# Patient Record
Sex: Male | Born: 1970 | Race: Black or African American | Hispanic: No | Marital: Married | Smoking: Never smoker
Health system: Southern US, Community
[De-identification: ages and names within clinical notes are randomized; demographics above are authoritative.]

## PROBLEM LIST (undated history)

## (undated) DIAGNOSIS — R748 Abnormal levels of other serum enzymes: Secondary | ICD-10-CM

## (undated) DIAGNOSIS — M25511 Pain in right shoulder: Secondary | ICD-10-CM

## (undated) DIAGNOSIS — M25512 Pain in left shoulder: Secondary | ICD-10-CM

---

## 2006-11-24 ENCOUNTER — Ambulatory Visit: Payer: Self-pay | Admitting: Family Medicine

## 2008-07-11 ENCOUNTER — Ambulatory Visit: Payer: Self-pay | Admitting: Family Medicine

## 2013-07-08 ENCOUNTER — Ambulatory Visit (INDEPENDENT_AMBULATORY_CARE_PROVIDER_SITE_OTHER): Payer: BC Managed Care – PPO | Admitting: Family Medicine

## 2013-07-08 VITALS — BP 120/80 | HR 62 | Temp 98.4°F | Resp 16 | Ht 71.0 in | Wt 213.0 lb

## 2013-07-08 DIAGNOSIS — M7632 Iliotibial band syndrome, left leg: Secondary | ICD-10-CM

## 2013-07-08 DIAGNOSIS — M629 Disorder of muscle, unspecified: Secondary | ICD-10-CM

## 2013-07-08 DIAGNOSIS — Z0000 Encounter for general adult medical examination without abnormal findings: Secondary | ICD-10-CM

## 2013-07-08 DIAGNOSIS — G5600 Carpal tunnel syndrome, unspecified upper limb: Secondary | ICD-10-CM

## 2013-07-08 DIAGNOSIS — M25569 Pain in unspecified knee: Secondary | ICD-10-CM

## 2013-07-08 DIAGNOSIS — M25539 Pain in unspecified wrist: Secondary | ICD-10-CM

## 2013-07-08 DIAGNOSIS — M25562 Pain in left knee: Secondary | ICD-10-CM

## 2013-07-08 DIAGNOSIS — M25531 Pain in right wrist: Secondary | ICD-10-CM

## 2013-07-08 LAB — CBC WITH DIFFERENTIAL/PLATELET
Basophils Absolute: 0 10*3/uL (ref 0.0–0.1)
Basophils Relative: 0 % (ref 0–1)
Eosinophils Absolute: 0 10*3/uL (ref 0.0–0.7)
Eosinophils Relative: 1 % (ref 0–5)
HCT: 44.8 % (ref 39.0–52.0)
Hemoglobin: 15.6 g/dL (ref 13.0–17.0)
Lymphocytes Relative: 34 % (ref 12–46)
Lymphs Abs: 1.6 10*3/uL (ref 0.7–4.0)
MCH: 29.4 pg (ref 26.0–34.0)
MCHC: 34.8 g/dL (ref 30.0–36.0)
MCV: 84.4 fL (ref 78.0–100.0)
Monocytes Absolute: 0.4 10*3/uL (ref 0.1–1.0)
Monocytes Relative: 9 % (ref 3–12)
Neutro Abs: 2.6 10*3/uL (ref 1.7–7.7)
Neutrophils Relative %: 56 % (ref 43–77)
Platelets: 212 10*3/uL (ref 150–400)
RBC: 5.31 MIL/uL (ref 4.22–5.81)
RDW: 14 % (ref 11.5–15.5)
WBC: 4.6 10*3/uL (ref 4.0–10.5)

## 2013-07-08 NOTE — Progress Notes (Addendum)
Subjective:    Patient ID: Mark Hamilton, male    DOB: 06/09/1970, 42 y.o.   MRN: [id number] This chart was scribed for Meredith Staggers, MD by Valera Castle, ED Scribe. This patient was seen in room 02 and the patient's care was started at 10:22 AM.  Authored by Silas Sacramento. MD. Unable to change in [facility].   Chief Complaint  Patient presents with   Annual Exam    HPI Mark Hamilton is a 43 y.o. male New pt is here for a complete physical. He denies knowing when he last had physical, blood work. Longer than several years ago. He denies knowing when his last tetanus vaccination was. He denies having flu immunization this year. He denies family h/o early cancer, prostate cancer. He reports his father has h/o HTN, mother has h/o DM that is diet controlled. He denies his father taking medication. He denies h/o smoking, EtOH use, drug use. He reports being married with 2 children. He denies exercising regularly. He denies taking any medication regularly.   He reports right wrist pain, stating he may have carpal tunnel. 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 He denies having been seen for it. He states he has a wrist brace at home, but has not used it recently.   He also presents with intermittent, left knee pain for a few months. He denies any trauma to his knee. He states he started exercising on treadmill and after exercising he notices the pain. He denies it being a sharp pain, but states he can feel pain with certain movements. He reports his pain is usually worse at the end of the day. He denies having tried ice, heat for his knee.   He denies pain in his groin area with lifting. He denies any lumps to the same area. He denies complications with erections, intercourse. He denies other sexual partners besides his wife.   He reports he saw eye doctor last week, and was given new Rx. He reports he  last saw dentist 6 months ago. He denies any other symptoms.   PCP - No primary provider on file.  There are no active problems to display for this patient.  History reviewed. No pertinent past medical history. History reviewed. No pertinent past surgical history. Allergies  Allergen Reactions   Penicillins     Since childhood   Prior to Admission medications   Not on File   Review of Systems  Musculoskeletal: Positive for arthralgias (left knee) and myalgias (right wrist).  All other systems reviewed and are negative.      Objective:   Physical Exam  Vitals reviewed. Constitutional: He is oriented to person, place, and time. He appears well-developed and well-nourished.  HENT:  Head: Normocephalic and atraumatic.  Right Ear: External ear normal.  Left Ear: External ear normal.  Mouth/Throat: Oropharynx is clear and moist.  Eyes: Conjunctivae and EOM are normal. Pupils are equal, round, and reactive to light.  Neck: Normal range of motion. Neck supple. No thyromegaly present.  Cardiovascular: Normal rate, regular rhythm, normal heart sounds and intact distal pulses.   Pulmonary/Chest: Effort normal and breath sounds normal. No respiratory distress. He has no wheezes.  Abdominal: Soft. He exhibits no distension. There is no tenderness. Hernia confirmed negative in the right inguinal area and confirmed negative in the left inguinal area.  Musculoskeletal: Normal range of  motion. He exhibits no edema and no tenderness.  Right wrist FROM. Negative Tinel's. Negative Phalen's. Normal Thenar. Normal hypothenar bulk. Left knee FROM. No effusion, erythema. No joint line tenderness. Described area of pain is over lateral joint at ITB insertion, but negative OBER testing. Slight tight IT band. Negative McMurray. Negative varus and valgus stress. Negative Drawer. Negative Lachman's.   Lymphadenopathy:    He has no cervical adenopathy.  Neurological: He is alert and oriented to person,  place, and time. He has normal reflexes.  Skin: Skin is warm and dry.  Skin intact.   Psychiatric: He has a normal mood and affect. His behavior is normal.   BP 120/80   Pulse 62   Temp(Src) 98.4 F (36.9 C) (Oral)   Resp 16   Ht 5\' 11"  (1.803 m)   Wt 213 lb (96.616 kg)   BMI 29.72 kg/m2   SpO2 99%   Visual Acuity Screening   Right eye Left eye Both eyes  Without correction: n/a 20/40 20/30  With correction:         Assessment & Plan:  Mark Hamilton is a 43 y.o. male Annual physical exam - Plan: COMPLETE METABOLIC PANEL WITH GFR, Lipid panel, TSH, CBC with Differential - anticipatory guidance below. Labs above. rec'd exercise, and to check on tdap status - recommended esp. With children at home.   Right wrist pain - suspected CTS (carpal tunnel syndrome) - mild, without positive testing today.   Left lateral knee pain, Iliotibial band syndrome of left side suspected. No bony ttp, reassuring exam,  ITB stretch demonstrated and HEP as below. Recheck in next few weeks if not improving - sooner if worse.   No orders of the defined types were placed in this encounter.   Patient Instructions  You should receive a call or letter about your lab results within the next week to 10 days.  Check into tetanus - if not received "TDAP" in past 10 years, recommend return to have this done.  Brace if needed for wrist and stretches for knee, other info below. If not improving in next 2-3 weeks - recheck to discuss further and possible xrays.  Carpal Tunnel Syndrome The carpal tunnel is a narrow area located on the palm side of your wrist. The tunnel is formed by the wrist bones and ligaments. Nerves, blood vessels, and tendons pass through the carpal tunnel. Repeated wrist motion or certain diseases may cause swelling within the tunnel. This swelling pinches the main nerve in the wrist (median nerve) and causes the painful hand and arm condition called carpal tunnel syndrome. CAUSES   Repeated wrist  motions.  Wrist injuries.  Certain diseases like arthritis, diabetes, alcoholism, hyperthyroidism, and kidney failure.  Obesity.  Pregnancy. SYMPTOMS   A "pins and needles" feeling in your fingers or hand.  Tingling or numbness in your fingers or hand.  An aching feeling in your entire arm.  Wrist pain that goes up your arm to your shoulder.  Pain that goes down into your palm or fingers.  A weak feeling in your hands. DIAGNOSIS  Your caregiver will take your history and perform a physical exam. An electromyography test may be needed. This test measures electrical signals sent out by the muscles. The electrical signals are usually slowed by carpal tunnel syndrome. You may also need X-rays. TREATMENT  Carpal tunnel syndrome may clear up by itself. Your caregiver may recommend a wrist splint or medicine such as a nonsteroidal anti-inflammatory medicine. Cortisone injections  may help. Sometimes, surgery may be needed to free the pinched nerve.  HOME CARE INSTRUCTIONS   Take all medicine as directed by your caregiver. Only take over-the-counter or prescription medicines for pain, discomfort, or fever as directed by your caregiver.  If you were given a splint to keep your wrist from bending, wear it as directed. It is important to wear the splint at night. Wear the splint for as long as you have pain or numbness in your hand, arm, or wrist. This may take 1 to 2 months.  Rest your wrist from any activity that may be causing your pain. If your symptoms are work-related, you may need to talk to your employer about changing to a job that does not require using your wrist.  Put ice on your wrist after long periods of wrist activity.  Put ice in a plastic bag.  Place a towel between your skin and the bag.  Leave the ice on for 15-20 minutes, 03-04 times a day.  Keep all follow-up visits as directed by your caregiver. This includes any orthopedic referrals, physical therapy, and  rehabilitation. Any delay in getting necessary care could result in a delay or failure of your condition to heal. SEEK IMMEDIATE MEDICAL CARE IF:   You have new, unexplained symptoms.  Your symptoms get worse and are not helped or controlled with medicines. MAKE SURE YOU:   Understand these instructions.  Will watch your condition.  Will get help right away if you are not doing well or get worse. Document Released: 05/17/2000 Document Revised: 08/12/2011 Document Reviewed: 04/05/2011 [facility] Patient Information 2014 [place], [place].  Iliotibial Band Syndrome with Rehab The iliotibial (IT) band is a tendon that connects the hip muscles to the shinbone (tibia) and to one of the bones of the pelvis (ileum). The IT band passes by the knee and is often irritated by the outer portion of the knee (lateral femoral condyle). A fluid filled sac (bursa) exists between the tendon and the bone, to cushion and reduce friction. Overuse of the tendon may cause excessive friction, which results in IT band syndrome. This condition involves inflammation of the bursa (bursitis) and/or inflammation of the IT band (tendinitis). SYMPTOMS   Pain, tenderness, swelling, warmth, or redness over the IT band, at the outer knee (above the joint).  Pain that travels up or down the thigh or leg.  Initially, pain at the beginning of an exercise, that decreases once warmed up. Eventually, pain throughout the activity, getting worse as the activity continues. May cause the athlete to stop in the middle of training or competing.  Pain that gets worse when running down hills or stairs, on banked tracks, or next to the curb on the street.  Pain that increases when the foot of the affected leg hits the ground.  Possibly, a crackling sound (crepitation) when the tendon or bursa is moved or touched. CAUSES  IT band syndrome is caused by irritation of the IT band and the underlying bursa. This eventually results in  inflammation and pain. IT band syndrome is an overuse injury.  RISK INCREASES WITH:  Sports with repetitive knee-bending activities (distance running, cycling).  Incorrect training techniques, including sudden changes in the intensity, frequency, or duration of training.  Not enough rest between workouts.  Poor strength and flexibility, especially a tight IT band.  Failure to warm up properly before activity.  Bow legs.  Arthritis of the knee. PREVENTION   Warm up and stretch properly before activity.  Allow for adequate recovery between workouts.  Maintain physical fitness:  Strength, flexibility, and endurance.  Cardiovascular fitness.  Learn and use proper training technique, including reducing running mileage, shortening stride, and avoiding running on hills and banked surfaces.  Wear arch supports (orthotics), if you have flat feet. PROGNOSIS  If treated properly, IT band syndrome usually goes away within 6 weeks of treatment. RELATED COMPLICATIONS   Longer healing time, if not properly treated, or if not given enough time to heal.  Recurring inflammation of the tendon and bursa, that may result in a chronic condition.  Recurring symptoms, if activity is resumed too soon, with overuse, with a direct blow, or with poor training technique.  Inability to complete training or competition. TREATMENT  Treatment first involves the use of ice and medicine, to reduce pain and inflammation. The use of strengthening and stretching exercises may help reduce pain with activity. These exercises may be performed at home or with a therapist. For individuals with flat feet, an arch support (orthotic) may be helpful. Some individuals find that wearing a knee sleeve or compression bandage around the knee during workouts provides some relief. Certain training techniques, such as adjusting stride length, avoiding running on hills or stairs, changing the direction you run on a circular or  banked track, or changing the side of the road you run on, if you run next to the curb, may help decrease symptoms of IT band syndrome. Cyclists may need to change the seat height or foot position on their bicycles. An injection of cortisone into the bursa may be recommended. Surgery to remove the inflamed bursa and/or part of the IT band is only considered after at least 6 months of non-surgical treatment.  MEDICATION   If pain medicine is needed, nonsteroidal anti-inflammatory medicines (aspirin and ibuprofen), or other minor pain relievers (acetaminophen), are often advised.  Do not take pain medicine for 7 days before surgery.  Prescription pain relievers may be given, if your caregiver thinks they are needed. Use only as directed and only as much as you need.  Corticosteroid injections may be given by your caregiver. These injections should be reserved for the most serious cases, because they may only be given a certain number of times. HEAT AND COLD  Cold treatment (icing) should be applied for 10 to 15 minutes every 2 to 3 hours for inflammation and pain, and immediately after activity that aggravates your symptoms. Use ice packs or an ice massage.  Heat treatment may be used before performing stretching and strengthening activities prescribed by your caregiver, physical therapist, or athletic trainer. Use a heat pack or a warm water soak. SEEK MEDICAL CARE IF:   Symptoms get worse or do not improve in 2 to 4 weeks, despite treatment.  New, unexplained symptoms develop. (Drugs used in treatment may produce side effects.) EXERCISES  RANGE OF MOTION (ROM) AND STRETCHING EXERCISES - Iliotibial Band Syndrome These exercises may help you when beginning to rehabilitate your injury. Your symptoms may go away with or without further involvement from your physician, physical therapist or athletic trainer. While completing these exercises, remember:   Restoring tissue flexibility helps normal  motion to return to the joints. This allows healthier, less painful movement and activity.  An effective stretch should be held for at least 30 seconds.  A stretch should never be painful. You should only feel a gentle lengthening or release in the stretched tissue. STRETCH - Quadriceps, Prone   Lie on your stomach on a  firm surface, such as a bed or padded floor.  Bend your right / left knee and grasp your ankle. If you are unable to reach your ankle or pant leg, use a belt around your foot to lengthen your reach.  Gently pull your heel toward your buttocks. Your knee should not slide out to the side. You should feel a stretch in the front of your thigh and knee.  Hold this position for __________ seconds. Repeat __________ times. Complete this stretch __________ times per day.  STRETCH  Iliotibial Band  On the floor or bed, lie on your side, so your right / left leg is on top. Bend your knee and grab your ankle.  Slowly bring your knee back so that your thigh is in line with your trunk. Keep your heel at your buttocks and gently arch your back, so your head, shoulders and hips line up.  Slowly lower your leg so that your knee approaches the floor or bed, until you feel a gentle stretch on the outside of your right / left thigh. If you do not feel a stretch and your knee will not fall farther, place the heel of your opposite foot on top of your knee, and pull your thigh down farther.  Hold this stretch for __________ seconds. Repeat __________ times. Complete this stretch __________ times per day. STRENGTHENING EXERCISES - Iliotibial Band Syndrome Improving the flexibility of the IT band will best relieve your discomfort due to IT band syndrome. Strengthening exercises, however, can help improve both muscle endurance and joint mechanics, reducing the factors that can contribute to this condition. Your physician, physical therapist or athletic trainer may provide you with exercises that  train specific muscle groups that are especially weak. The following exercises target muscles that are often weak in people who have IT band syndrome. STRENGTH - Hip Abductors, Straight Leg Raises  Be aware of your form throughout the entire exercise, so that you exercise the correct muscles. Poor form means that you are not strengthening the correct muscles.  Lie on your side, so that your head, shoulders, knee and hip line up. You may bend your lower knee to help maintain your balance. Your right / left leg should be on top.  Roll your hips slightly forward, so that your hips are stacked directly over each other and your right / left knee is facing forward.  Lift your top leg up 4-6 inches, leading with your heel. Be sure that your foot does not drift forward and that your knee does not roll toward the ceiling.  Hold this position for __________ seconds. You should feel the muscles in your outer hip lifting (you may not notice this until your leg begins to tire).  Slowly lower your leg to the starting position. Allow the muscles to fully relax before beginning the next repetition. Repeat __________ times. Complete this exercise __________ times per day.  STRENGTH - Quad/VMO, Isometric  Sit in a chair with your right / left knee slightly bent. With your fingertips, feel the VMO muscle (just above the inside of your knee). The VMO is important in controlling the position of your kneecap.  Keeping your fingertips on this muscle. Without actually moving your leg, attempt to drive your knee down, as if straightening your leg. You should feel your VMO tense. If you have a difficult time, you may wish to try the same exercise on your healthy knee first.  Tense this muscle as hard as you can, without increasing  any knee pain.  Hold for __________ seconds. Relax the muscles slowly and completely between each repetition. Repeat __________ times. Complete this exercise __________ times per day.    Document Released: 05/20/2005 Document Revised: 08/12/2011 Document Reviewed: 09/01/2008 [facility] Patient Information 2014 [place], [place].  Keeping you healthy  Get these tests  Blood pressure- Have your blood pressure checked once a year by your healthcare provider.  Normal blood pressure is 120/80.  Weight- Have your body mass index (BMI) calculated to screen for obesity.  BMI is a measure of body fat based on height and weight. You can also calculate your own BMI at https://www.west-esparza.com/www.[email or web address]/.  Cholesterol- Have your cholesterol checked regularly starting at age 42, sooner may be necessary if you have diabetes, high blood pressure, if a family member developed heart diseases at an early age or if you smoke.   Chlamydia, HIV, and other sexual transmitted disease- Get screened each year until the age of 42 then within three months of each new sexual partner.  Diabetes- Have your blood sugar checked regularly if you have high blood pressure, high cholesterol, a family history of diabetes or if you are overweight.  Get these vaccines  Flu shot- Every fall.  Tetanus shot- Every 10 years.  Menactra- Single dose; prevents meningitis.  Take these steps  Don't smoke- If you do smoke, ask your healthcare provider about quitting. For tips on how to quit, go to www.smokefree.gov or call 1-800-QUIT-NOW.  Be physically active- Exercise 5 days a week for at least 30 minutes.  If you are not already physically active start slow and gradually work up to 30 minutes of moderate physical activity.  Examples of moderate activity include walking briskly, mowing the yard, dancing, swimming bicycling, etc.  Eat a healthy diet- Eat a variety of healthy foods such as fruits, vegetables, low fat milk, low fat cheese, yogurt, lean meats, poultry, fish, beans, tofu, etc.  For more information on healthy eating, go to www.thenutritionsource.org  Drink alcohol in moderation- Limit alcohol intake two drinks or  less a day.  Never drink and drive.  Dentist- Brush and floss teeth twice daily; visit your dentis twice a year.  Depression-Your emotional health is as important as your physical health.  If you're feeling down, losing interest in things you normally enjoy please talk with your healthcare provider.  Gun Safety- If you keep a gun in your home, keep it unloaded and with the safety lock on.  Bullets should be stored separately.  Helmet use- Always wear a helmet when riding a motorcycle, bicycle, rollerblading or skateboarding.  Safe sex- If you may be exposed to a sexually transmitted infection, use a condom  Seat belts- Seat bels can save your life; always wear one.  Smoke/Carbon Monoxide detectors- These detectors need to be installed on the appropriate level of your home.  Replace batteries at least once a year.  Skin Cancer- When out in the sun, cover up and use sunscreen SPF 15 or higher.  Violence- If anyone is threatening or hurting you, please tell your healthcare provider.  I personally performed the services described in this documentation, which was scribed in my presence. The recorded information has been reviewed and considered, and addended by me as needed.

## 2013-07-08 NOTE — Patient Instructions (Addendum)
You should receive a call or letter about your lab results within the next week to 10 days.  Check into tetanus - if not received "TDAP" in past 10 years, recommend return to have this done.  Brace if needed for wrist and stretches for knee, other info below. If not improving in next 2-3 weeks - recheck to discuss further and possible xrays.  Carpal Tunnel Syndrome The carpal tunnel is a narrow area located on the palm side of your wrist. The tunnel is formed by the wrist bones and ligaments. Nerves, blood vessels, and tendons pass through the carpal tunnel. Repeated wrist motion or certain diseases may cause swelling within the tunnel. This swelling pinches the main nerve in the wrist (median nerve) and causes the painful hand and arm condition called carpal tunnel syndrome. CAUSES   Repeated wrist motions.  Wrist injuries.  Certain diseases like arthritis, diabetes, alcoholism, hyperthyroidism, and kidney failure.  Obesity.  Pregnancy. SYMPTOMS   A "pins and needles" feeling in your fingers or hand.  Tingling or numbness in your fingers or hand.  An aching feeling in your entire arm.  Wrist pain that goes up your arm to your shoulder.  Pain that goes down into your palm or fingers.  A weak feeling in your hands. DIAGNOSIS  Your caregiver will take your history and perform a physical exam. An electromyography test may be needed. This test measures electrical signals sent out by the muscles. The electrical signals are usually slowed by carpal tunnel syndrome. You may also need X-rays. TREATMENT  Carpal tunnel syndrome may clear up by itself. Your caregiver may recommend a wrist splint or medicine such as a nonsteroidal anti-inflammatory medicine. Cortisone injections may help. Sometimes, surgery may be needed to free the pinched nerve.  HOME CARE INSTRUCTIONS   Take all medicine as directed by your caregiver. Only take over-the-counter or prescription medicines for pain,  discomfort, or fever as directed by your caregiver.  If you were given a splint to keep your wrist from bending, wear it as directed. It is important to wear the splint at night. Wear the splint for as long as you have pain or numbness in your hand, arm, or wrist. This may take 1 to 2 months.  Rest your wrist from any activity that may be causing your pain. If your symptoms are work-related, you may need to talk to your employer about changing to a job that does not require using your wrist.  Put ice on your wrist after long periods of wrist activity.  Put ice in a plastic bag.  Place a towel between your skin and the bag.  Leave the ice on for 15-20 minutes, 03-04 times a day.  Keep all follow-up visits as directed by your caregiver. This includes any orthopedic referrals, physical therapy, and rehabilitation. Any delay in getting necessary care could result in a delay or failure of your condition to heal. SEEK IMMEDIATE MEDICAL CARE IF:   You have new, unexplained symptoms.  Your symptoms get worse and are not helped or controlled with medicines. MAKE SURE YOU:   Understand these instructions.  Will watch your condition.  Will get help right away if you are not doing well or get worse. Document Released: 05/17/2000 Document Revised: 08/12/2011 Document Reviewed: 04/05/2011 [facility] Patient Information 2014 [place], [place].  Iliotibial Band Syndrome with Rehab The iliotibial (IT) band is a tendon that connects the hip muscles to the shinbone (tibia) and to one of the bones of the  pelvis (ileum). The IT band passes by the knee and is often irritated by the outer portion of the knee (lateral femoral condyle). A fluid filled sac (bursa) exists between the tendon and the bone, to cushion and reduce friction. Overuse of the tendon may cause excessive friction, which results in IT band syndrome. This condition involves inflammation of the bursa (bursitis) and/or inflammation of the IT band  (tendinitis). SYMPTOMS   Pain, tenderness, swelling, warmth, or redness over the IT band, at the outer knee (above the joint).  Pain that travels up or down the thigh or leg.  Initially, pain at the beginning of an exercise, that decreases once warmed up. Eventually, pain throughout the activity, getting worse as the activity continues. May cause the athlete to stop in the middle of training or competing.  Pain that gets worse when running down hills or stairs, on banked tracks, or next to the curb on the street.  Pain that increases when the foot of the affected leg hits the ground.  Possibly, a crackling sound (crepitation) when the tendon or bursa is moved or touched. CAUSES  IT band syndrome is caused by irritation of the IT band and the underlying bursa. This eventually results in inflammation and pain. IT band syndrome is an overuse injury.  RISK INCREASES WITH:  Sports with repetitive knee-bending activities (distance running, cycling).  Incorrect training techniques, including sudden changes in the intensity, frequency, or duration of training.  Not enough rest between workouts.  Poor strength and flexibility, especially a tight IT band.  Failure to warm up properly before activity.  Bow legs.  Arthritis of the knee. PREVENTION   Warm up and stretch properly before activity.  Allow for adequate recovery between workouts.  Maintain physical fitness:  Strength, flexibility, and endurance.  Cardiovascular fitness.  Learn and use proper training technique, including reducing running mileage, shortening stride, and avoiding running on hills and banked surfaces.  Wear arch supports (orthotics), if you have flat feet. PROGNOSIS  If treated properly, IT band syndrome usually goes away within 6 weeks of treatment. RELATED COMPLICATIONS   Longer healing time, if not properly treated, or if not given enough time to heal.  Recurring inflammation of the tendon and  bursa, that may result in a chronic condition.  Recurring symptoms, if activity is resumed too soon, with overuse, with a direct blow, or with poor training technique.  Inability to complete training or competition. TREATMENT  Treatment first involves the use of ice and medicine, to reduce pain and inflammation. The use of strengthening and stretching exercises may help reduce pain with activity. These exercises may be performed at home or with a therapist. For individuals with flat feet, an arch support (orthotic) may be helpful. Some individuals find that wearing a knee sleeve or compression bandage around the knee during workouts provides some relief. Certain training techniques, such as adjusting stride length, avoiding running on hills or stairs, changing the direction you run on a circular or banked track, or changing the side of the road you run on, if you run next to the curb, may help decrease symptoms of IT band syndrome. Cyclists may need to change the seat height or foot position on their bicycles. An injection of cortisone into the bursa may be recommended. Surgery to remove the inflamed bursa and/or part of the IT band is only considered after at least 6 months of non-surgical treatment.  MEDICATION   If pain medicine is needed, nonsteroidal anti-inflammatory medicines (aspirin  and ibuprofen), or other minor pain relievers (acetaminophen), are often advised.  Do not take pain medicine for 7 days before surgery.  Prescription pain relievers may be given, if your caregiver thinks they are needed. Use only as directed and only as much as you need.  Corticosteroid injections may be given by your caregiver. These injections should be reserved for the most serious cases, because they may only be given a certain number of times. HEAT AND COLD  Cold treatment (icing) should be applied for 10 to 15 minutes every 2 to 3 hours for inflammation and pain, and immediately after activity that  aggravates your symptoms. Use ice packs or an ice massage.  Heat treatment may be used before performing stretching and strengthening activities prescribed by your caregiver, physical therapist, or athletic trainer. Use a heat pack or a warm water soak. SEEK MEDICAL CARE IF:   Symptoms get worse or do not improve in 2 to 4 weeks, despite treatment.  New, unexplained symptoms develop. (Drugs used in treatment may produce side effects.) EXERCISES  RANGE OF MOTION (ROM) AND STRETCHING EXERCISES - Iliotibial Band Syndrome These exercises may help you when beginning to rehabilitate your injury. Your symptoms may go away with or without further involvement from your physician, physical therapist or athletic trainer. While completing these exercises, remember:   Restoring tissue flexibility helps normal motion to return to the joints. This allows healthier, less painful movement and activity.  An effective stretch should be held for at least 30 seconds.  A stretch should never be painful. You should only feel a gentle lengthening or release in the stretched tissue. STRETCH - Quadriceps, Prone   Lie on your stomach on a firm surface, such as a bed or padded floor.  Bend your right / left knee and grasp your ankle. If you are unable to reach your ankle or pant leg, use a belt around your foot to lengthen your reach.  Gently pull your heel toward your buttocks. Your knee should not slide out to the side. You should feel a stretch in the front of your thigh and knee.  Hold this position for __________ seconds. Repeat __________ times. Complete this stretch __________ times per day.  STRETCH  Iliotibial Band  On the floor or bed, lie on your side, so your right / left leg is on top. Bend your knee and grab your ankle.  Slowly bring your knee back so that your thigh is in line with your trunk. Keep your heel at your buttocks and gently arch your back, so your head, shoulders and hips line  up.  Slowly lower your leg so that your knee approaches the floor or bed, until you feel a gentle stretch on the outside of your right / left thigh. If you do not feel a stretch and your knee will not fall farther, place the heel of your opposite foot on top of your knee, and pull your thigh down farther.  Hold this stretch for __________ seconds. Repeat __________ times. Complete this stretch __________ times per day. STRENGTHENING EXERCISES - Iliotibial Band Syndrome Improving the flexibility of the IT band will best relieve your discomfort due to IT band syndrome. Strengthening exercises, however, can help improve both muscle endurance and joint mechanics, reducing the factors that can contribute to this condition. Your physician, physical therapist or athletic trainer may provide you with exercises that train specific muscle groups that are especially weak. The following exercises target muscles that are often weak in  people who have IT band syndrome. STRENGTH - Hip Abductors, Straight Leg Raises  Be aware of your form throughout the entire exercise, so that you exercise the correct muscles. Poor form means that you are not strengthening the correct muscles.  Lie on your side, so that your head, shoulders, knee and hip line up. You may bend your lower knee to help maintain your balance. Your right / left leg should be on top.  Roll your hips slightly forward, so that your hips are stacked directly over each other and your right / left knee is facing forward.  Lift your top leg up 4-6 inches, leading with your heel. Be sure that your foot does not drift forward and that your knee does not roll toward the ceiling.  Hold this position for __________ seconds. You should feel the muscles in your outer hip lifting (you may not notice this until your leg begins to tire).  Slowly lower your leg to the starting position. Allow the muscles to fully relax before beginning the next repetition. Repeat  __________ times. Complete this exercise __________ times per day.  STRENGTH - Quad/VMO, Isometric  Sit in a chair with your right / left knee slightly bent. With your fingertips, feel the VMO muscle (just above the inside of your knee). The VMO is important in controlling the position of your kneecap.  Keeping your fingertips on this muscle. Without actually moving your leg, attempt to drive your knee down, as if straightening your leg. You should feel your VMO tense. If you have a difficult time, you may wish to try the same exercise on your healthy knee first.  Tense this muscle as hard as you can, without increasing any knee pain.  Hold for __________ seconds. Relax the muscles slowly and completely between each repetition. Repeat __________ times. Complete this exercise __________ times per day.  Document Released: 05/20/2005 Document Revised: 08/12/2011 Document Reviewed: 09/01/2008 [facility] Patient Information 2014 [place], [place].  Keeping you healthy  Get these tests  Blood pressure- Have your blood pressure checked once a year by your healthcare provider.  Normal blood pressure is 120/80.  Weight- Have your body mass index (BMI) calculated to screen for obesity.  BMI is a measure of body fat based on height and weight. You can also calculate your own BMI at https://www.west-esparza.com/.  Cholesterol- Have your cholesterol checked regularly starting at age 86, sooner may be necessary if you have diabetes, high blood pressure, if a family member developed heart diseases at an early age or if you smoke.   Chlamydia, HIV, and other sexual transmitted disease- Get screened each year until the age of 67 then within three months of each new sexual partner.  Diabetes- Have your blood sugar checked regularly if you have high blood pressure, high cholesterol, a family history of diabetes or if you are overweight.  Get these vaccines  Flu shot- Every fall.  Tetanus shot- Every 10  years.  Menactra- Single dose; prevents meningitis.  Take these steps  Don't smoke- If you do smoke, ask your healthcare provider about quitting. For tips on how to quit, go to www.smokefree.gov or call 1-800-QUIT-NOW.  Be physically active- Exercise 5 days a week for at least 30 minutes.  If you are not already physically active start slow and gradually work up to 30 minutes of moderate physical activity.  Examples of moderate activity include walking briskly, mowing the yard, dancing, swimming bicycling, etc.  Eat a healthy diet- Eat a variety of  healthy foods such as fruits, vegetables, low fat milk, low fat cheese, yogurt, lean meats, poultry, fish, beans, tofu, etc.  For more information on healthy eating, go to www.thenutritionsource.org  Drink alcohol in moderation- Limit alcohol intake two drinks or less a day.  Never drink and drive.  Dentist- Brush and floss teeth twice daily; visit your dentis twice a year.  Depression-Your emotional health is as important as your physical health.  If you're feeling down, losing interest in things you normally enjoy please talk with your healthcare provider.  Gun Safety- If you keep a gun in your home, keep it unloaded and with the safety lock on.  Bullets should be stored separately.  Helmet use- Always wear a helmet when riding a motorcycle, bicycle, rollerblading or skateboarding.  Safe sex- If you may be exposed to a sexually transmitted infection, use a condom  Seat belts- Seat bels can save your life; always wear one.  Smoke/Carbon Monoxide detectors- These detectors need to be installed on the appropriate level of your home.  Replace batteries at least once a year.  Skin Cancer- When out in the sun, cover up and use sunscreen SPF 15 or higher.  Violence- If anyone is threatening or hurting you, please tell your healthcare provider.

## 2013-07-09 LAB — COMPLETE METABOLIC PANEL WITH GFR
ALT: 31 U/L (ref 0–53)
AST: 24 U/L (ref 0–37)
Albumin: 4.3 g/dL (ref 3.5–5.2)
Alkaline Phosphatase: 85 U/L (ref 39–117)
BUN: 12 mg/dL (ref 6–23)
CO2: 28 meq/L (ref 19–32)
Calcium: 9.7 mg/dL (ref 8.4–10.5)
Chloride: 101 mEq/L (ref 96–112)
Creat: 0.98 mg/dL (ref 0.50–1.35)
GLUCOSE: 100 mg/dL — AB (ref 70–99)
POTASSIUM: 4.3 meq/L (ref 3.5–5.3)
Sodium: 136 mEq/L (ref 135–145)
Total Bilirubin: 0.8 mg/dL (ref 0.2–1.2)
Total Protein: 7.3 g/dL (ref 6.0–8.3)

## 2013-07-09 LAB — LIPID PANEL
CHOLESTEROL: 208 mg/dL — AB (ref 0–200)
HDL: 36 mg/dL — ABNORMAL LOW (ref >39)
LDL Cholesterol: 117 mg/dL — ABNORMAL HIGH (ref 0–99)
Total CHOL/HDL Ratio: 5.8 Ratio
Triglycerides: 273 mg/dL — ABNORMAL HIGH (ref <150)
VLDL: 55 mg/dL — ABNORMAL HIGH (ref 0–40)

## 2013-07-09 LAB — TSH: TSH: 1.983 u[IU]/mL (ref 0.350–4.500)

## 2014-03-01 ENCOUNTER — Ambulatory Visit: Payer: BC Managed Care – PPO

## 2014-03-04 ENCOUNTER — Ambulatory Visit (INDEPENDENT_AMBULATORY_CARE_PROVIDER_SITE_OTHER): Payer: BC Managed Care – PPO | Admitting: Family Medicine

## 2014-03-04 ENCOUNTER — Ambulatory Visit (INDEPENDENT_AMBULATORY_CARE_PROVIDER_SITE_OTHER): Payer: BC Managed Care – PPO

## 2014-03-04 VITALS — BP 122/82 | HR 75 | Temp 97.8°F | Resp 16 | Ht 71.5 in | Wt 219.0 lb

## 2014-03-04 DIAGNOSIS — R6882 Decreased libido: Secondary | ICD-10-CM

## 2014-03-04 DIAGNOSIS — M79645 Pain in left finger(s): Secondary | ICD-10-CM

## 2014-03-04 DIAGNOSIS — R635 Abnormal weight gain: Secondary | ICD-10-CM

## 2014-03-04 DIAGNOSIS — R5383 Other fatigue: Secondary | ICD-10-CM

## 2014-03-04 NOTE — Progress Notes (Signed)
Subjective:    Patient ID: RAYKWON HOBBS, male    DOB: 1971-02-01, 43 y.o.   MRN: [id number]  HPI Antwone GARY GABRIELSEN is a 43 y.o. male  L hand pain for about a month or more  - may have hyperextended.  No swelling initially, just sore. Pain in 2nd MCP L hand. No known swelling or redness now. Still sore. No treatment other than decrease in use.   R hand dominant.   Also would like to have testosterone checked. Sexually active with wife only, no extramarital sexual contact. Just not enjoying sex as much as in past and sex drive is a little bit lower. Able to achieve erection, and maintain erection normally. Denies marital discord but some stress with wife going to school, he is by himself with kids. Wife in med school. Denies depression. Has not exercised recently. Feels "lazy", and gaining some weight. Denies heat or cold intolerance. No hx of thyroid disorder. Some associated fatigue.    There are no active problems to display for this patient.  History reviewed. No pertinent past medical history. History reviewed. No pertinent past surgical history. Allergies  Allergen Reactions  . Penicillins     Since childhood   Prior to Admission medications   Not on File   History   Social History  . Marital Status: Married    Spouse Name: N/A    Number of Children: N/A  . Years of Education: N/A   Occupational History  . Not on file.   Social History Main Topics  . Smoking status: Never Smoker   . Smokeless tobacco: Not on file  . Alcohol Use: Not on file  . Drug Use: Not on file  . Sexual Activity: Not on file   Other Topics Concern  . Not on file   Social History Narrative  . No narrative on file       Review of Systems  Constitutional: Negative for fever.  Musculoskeletal: Negative for joint swelling.  Skin: Negative for color change, rash and wound.       Objective:   Physical Exam  Vitals reviewed. Constitutional: He is oriented to person, place, and  time. He appears well-developed and well-nourished.  HENT:  Head: Normocephalic and atraumatic.  Eyes: EOM are normal. Pupils are equal, round, and reactive to light.  Neck: No JVD present. Carotid bruit is not present. No thyromegaly present.  Cardiovascular: Normal rate, regular rhythm and normal heart sounds.   No murmur heard. Pulmonary/Chest: Effort normal and breath sounds normal. He has no rales.  Genitourinary: Testes normal. Right testis shows no mass, no swelling and no tenderness. Left testis shows no mass, no swelling and no tenderness.  Musculoskeletal: He exhibits no edema.       Left hand: He exhibits bony tenderness. He exhibits no swelling. Normal sensation noted. Normal strength noted.       Hands: Neurological: He is alert and oriented to person, place, and time.  Skin: Skin is warm and dry.  Psychiatric: He has a normal mood and affect.   Filed Vitals:   03/04/14 1424  BP: 122/82  Pulse: 75  Temp: 97.8 F (36.6 C)  TempSrc: Oral  Resp: 16  Height: 5' 11.5" (1.816 m)  Weight: 219 lb (99.338 kg)  SpO2: 98%    [facility] reading (PRIMARY) by  Dr. Neva Seat. L 2nd phalynx: nad, no fx seen.       Assessment & Plan:  BRONDON WANN is a 43 y.o.  male Pain of finger of left hand - Plan: DG Finger Index Left  ?hyperextension and capsular strain.  No bony findings. At this point - continue ROM, would avoid splinting, but can also avoid forceful grasp for few weeks, then recheck if not improved.   Decreased libido, Other fatigue, Weight gain - Plan: TSH, Testosterone  -suspect stress/psychogenic component.  Discussed exercise, labs above, then recheck to discuss further if persists.    No orders of the defined types were placed in this encounter.   Patient Instructions  You likely strained the "capsule" of the knuckle on your first finger. Avoid overuse or forceful grasping of this finger for next few weeks, but can continue range of motion to keep finger from getting  stiff. If pain not improving in next few weeks - can recheck.   You should receive a call or letter about your lab results within the next week to 10 days. Try to restart exercise - even walking can be beneficial for weight and possibly sex drive. If labs are normal, follow up to discuss other causes of these symptoms.

## 2014-03-04 NOTE — Patient Instructions (Addendum)
You likely strained the "capsule" of the knuckle on your first finger. Avoid overuse or forceful grasping of this finger for next few weeks, but can continue range of motion to keep finger from getting stiff. If pain not improving in next few weeks - can recheck.   You should receive a call or letter about your lab results within the next week to 10 days. Try to restart exercise - even walking can be beneficial for weight and possibly sex drive. If labs are normal, follow up to discuss other causes of these symptoms.

## 2014-03-05 LAB — TSH: TSH: 1.45 u[IU]/mL (ref 0.350–4.500)

## 2014-03-05 LAB — TESTOSTERONE: TESTOSTERONE: 377 ng/dL (ref 300–890)

## 2014-03-16 ENCOUNTER — Encounter: Payer: Self-pay | Admitting: Radiology

## 2015-12-31 IMAGING — CR DG FINGER INDEX 2+V*L*
1 series · 1 of 1 positions shown · non-contrast
Comparison: None.

CLINICAL DATA: Pain after hyperextension

EXAM:
LEFT SECOND FINGER 2+V

[PA]
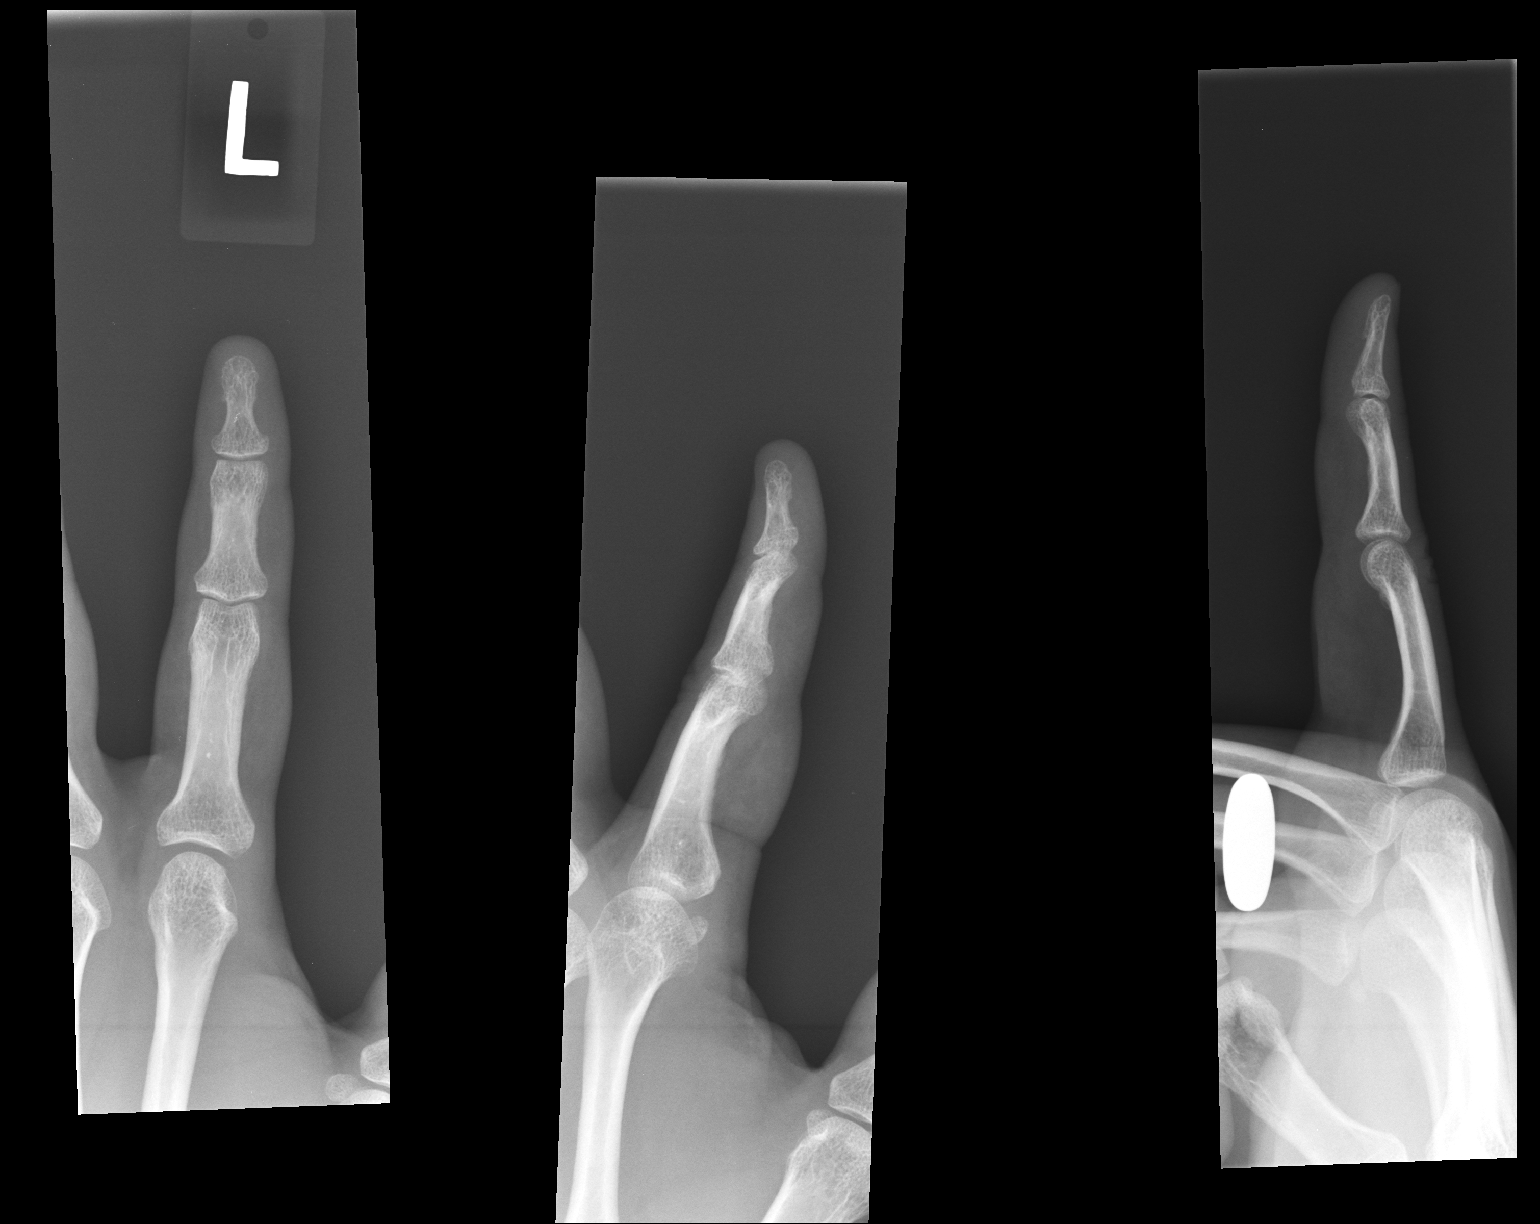

[1 of 1 positions shown; findings below may reference images not displayed]

FINDINGS: Frontal, oblique, and lateral views were obtained. There is no
fracture or dislocation. Joint spaces appear intact. No erosive
change.
IMPRESSION: No abnormality noted.

## 2016-07-02 ENCOUNTER — Telehealth: Payer: Self-pay

## 2016-07-02 NOTE — Telephone Encounter (Signed)
Pt calling about his wife having the flu and he would like tamiflu for himself to protect him   Please advise  [phone]

## 2016-07-02 NOTE — Telephone Encounter (Signed)
Patient advised would need to be seen.  Patient understood

## 2018-04-13 ENCOUNTER — Ambulatory Visit: Admit: 2018-04-13 | Discharge: 2018-04-13 | Payer: MEDICAID | Attending: Family Medicine | Primary: Registered Nurse

## 2018-04-13 DIAGNOSIS — Z7689 Persons encountering health services in other specified circumstances: Secondary | ICD-10-CM

## 2018-04-13 MED ORDER — MELOXICAM 15 MG PO TABS
15 MG | ORAL_TABLET | Freq: Every day | ORAL | 3 refills | Status: DC
Start: 2018-04-13 — End: 2019-04-14

## 2018-04-13 NOTE — Progress Notes (Addendum)
[facility] PHYSICIANS  [facility]  [address]  [address]  [place] [address]  Dept: [phone]      Jeffrey Zhang is a 47 y.o. male who presents today for follow up on his  medical conditions as noted below.      Chief Complaint   Patient presents with   ??? Establish Care   ??? Shoulder Pain     picked up his child 3 weeks ago and it has been bothering him since   ??? Back Pain     Between shoulder blades was doing some exercises and felt he stretched too far       There is no problem list on file for this patient.     History reviewed. No pertinent past medical history.   History reviewed. No pertinent surgical history.  Family History   Problem Relation Age of Onset   ??? High Blood Pressure Father        Current Outpatient Medications   Medication Sig Dispense Refill   ??? meloxicam (MOBIC) 15 MG tablet Take 1 tablet by mouth daily 30 tablet 3     No current facility-administered medications for this visit.      ALLERGIES:    Allergies   Allergen Reactions   ??? Pcn [Penicillins]        Social History     Tobacco Use   ??? Smoking status: Never Smoker   ??? Smokeless tobacco: Never Used   Substance Use Topics   ??? Alcohol use: Never     Frequency: Never        No results found for: LDLCALC, LDLCHOLESTEROL, HDL, BUN, CREATININE, GLUCOSE, LABA1C, LABMICR           Subjective:      HPI  Here today as a new patient to establish care  He also states a couple weeks ago or more he was lifting his child out of the highchair and developed sudden shoulder pain in the left shoulder.  Now if he goes to certain ranges of motion it hurts more.  He did try taking some ibuprofen 4 of them and it gave him an upset stomach  He also states he leaned back in the sauna and then developed some upper thoracic discomfort.  He states they recently have moved into a house that he has been doing a lot of lifting.    Review of Systems:     Constitutional: Negative for fever, appetite change and fatigue.         Family social and medical history reviewed and unchanged     HENT: Negative.  Negative for nosebleeds, trouble swallowing and neck pain.    Eyes: Negative for photophobia and visual disturbance.   Respiratory: Negative.  Negative for chest tightness and shortness of breath.    Cardiovascular: Negative.  Negative for chest pain and leg swelling.   Gastrointestinal: Negative.  Negative for abdominal pain and blood in stool.   Endocrine: Negative for cold intolerance and polyuria.   Genitourinary: Negative for dysuria and hematuria.   Musculoskeletal: Negative.    Skin: Negative for rash.   Allergic/Immunologic: Negative.    Neurological: Negative.  Negative for dizziness, weakness and numbness.   Hematological: Negative.  Negative for adenopathy. Does not bruise/bleed easily.   Psychiatric/Behavioral: Negative for sleep disturbance, dysphoric mood and  decreased concentration. The patient is not nervous/anxious.        Objective:     Physical Exam:  Nursing note and vitals reviewed.  BP 111/70    Pulse 79    Ht 6' (1.829 m)    Wt 193 lb (87.5 kg)    SpO2 98%    BMI 26.18 kg/m??   Constitutional: He is oriented to person, place, and time. He   appears well-developed and well-nourished.  HENT:   Head: Normocephalic and atraumatic.    Right Ear: External ear normal. Tympanic membrane is not erythematous. No middle ear effusion.    Left Ear: External ear normal. Tympanic membrane is not erythematous.  No middle ear effusion.   Nose: No mucosal edema.   Mouth/Throat: Oropharynx is clear and moist. No posterior oropharyngeal erythema.    Eyes: Conjunctivae and EOM are normal. Pupils are equal, round, and reactive to light.    Neck: Normal range of motion. Neck supple. No thyromegaly present.   Cardiovascular: Normal rate, regular rhythm and normal heart sounds.    No murmur heard.  Pulmonary/Chest: Effort normal and breath sounds normal. He has no wheezes. Hehas no rales.   Abdominal: Soft. Bowel sounds are normal. He  exhibits no distension and no mass.  There is no tenderness. There is no rebound and no guarding.   Genitourinary/Anorectal:deferred  Musculoskeletal: Normal range of motion. He exhibits no edema or mild  Tenderness. Ac are a of the left shoulder  And tightness of the paraspinal muscles of the mid thoracic spine    Lymphadenopathy: He has no cervical adenopathy.   Neurological: He is alert and oriented to person, place, and time. He has normal reflexes.   Skin: Skin is warm and dry. No rash noted.   Psychiatric: He has a normal mood and affect. His   behavior is normal.       Assessment:      1. Establishing care with new doctor, encounter for    2. Acute pain of left shoulder    3. Thoracic sprain    4. Need for influenza vaccination    5. Well adult exam          Plan:      Call or return to clinic prn if these symptoms worsen or fail to improve as anticipated.  I have reviewed the instructions with the patient, answering all questions to his satisfaction.    No follow-ups on file.  Orders Placed This Encounter   Procedures   ??? XR SHOULDER LEFT (MIN 2 VIEWS)     Standing Status:   Future     Standing Expiration Date:   04/14/2019     Order Specific Question:   Reason for exam:     Answer:   pain   ??? INFLUENZA, QUADV, 3 YRS AND OLDER, IM PF, PREFILL SYR OR SDV, 0.5ML (AFLURIA QUADV, PF)   ??? CBC Auto Differential     Standing Status:   Future     Standing Expiration Date:   10/12/2018   ??? Comprehensive Metabolic Panel     Standing Status:   Future     Standing Expiration Date:   10/12/2018   ??? Lipid Panel     Standing Status:   Future     Standing Expiration Date:   10/12/2018     Order Specific Question:   Is Patient Fasting?/# of Hours     Answer:   yes   ??? T4, Free     Standing Status:   Future     Standing Expiration Date:   10/12/2018   ??? Thyroid Peroxidase  Antibody     Standing Status:   Future     Standing Expiration Date:   10/12/2018   ??? TSH without Reflex     Standing Status:   Future     Standing Expiration  Date:   10/12/2018   ??? Testosterone, Free     Standing Status:   Future     Standing Expiration Date:   05/13/2018   ??? [facility] [place]     Referral Priority:   Routine     Referral Type:   Eval and Treat     Referral Reason:   Specialty Services Required     Requested Specialty:   Physical Therapy     Number of Visits Requested:   1     Orders Placed This Encounter   Medications   ??? meloxicam (MOBIC) 15 MG tablet     Sig: Take 1 tablet by mouth daily     Dispense:  30 tablet     Refill:  3     Given a flu shot   Electronically signed by Eyvonne Mechanic, DO on 04/13/2018 at 2:25 PM

## 2018-04-13 NOTE — Progress Notes (Signed)
Vaccine Information Sheet, "Influenza - Inactivated"  given to Jeffrey Zhang, or parent/legal guardian of  Jeffrey Zhang and verbalized understanding.    Patient responses:    Have you ever had a reaction to a flu vaccine? No  Do you have any current illness?  No  Have you ever had Guillian Barre Syndrome?  No  Do you have a serious allergy to any of the following: Neomycin, Polymyxin, Thimerosal, eggs or egg products? No    Flu vaccine given per order. Please see immunization tab.    Risks and benefits explained.  Current VIS given.      Immunizations Administered     Name Date Dose Route    Influenza, Quadv, IM, PF (6 mo and older Fluzone, Flulaval, Fluarix, and 3 yrs and older Afluria) 04/13/2018 0.5 mL Intramuscular    Site: Deltoid- Left    Lot: [id number]    NDC: 29562-130-86

## 2018-04-16 ENCOUNTER — Inpatient Hospital Stay: Admit: 2018-04-16 | Discharge: 2018-04-16 | Payer: MEDICAID | Primary: Registered Nurse

## 2018-04-16 DIAGNOSIS — M25512 Pain in left shoulder: Secondary | ICD-10-CM

## 2018-04-16 NOTE — Consults (Signed)
'[]'  [place]  [address]  [phone]  F: [phone] '[x]'  [place]  [address]   [address]  P: [phone]  F: [phone] '[]'  [place]  [place]  P: [phone]  F: [phone] '[]'  [facility]  Outpatient Rehabilitation &  Therapy  [place]   [address]   [place]: [phone]  F: [phone]      Physical Therapy Upper Extremity Evaluation    Date:  04/16/2018  Patient: Jeffrey Zhang  DOB: Jun 29, 1970  MRN: [id number]  Physician: Claudette Stapler, DO   Insurance: Jennefer Bravo, 20 visits   Medical Diagnosis:   M25.512 (ICD-10-CM) - Acute pain of left shoulder   S23.9XXA (ICD-10-CM) - Thoracic sprain   Rehab Codes: H37.169, M25.612, M54.6  Onset Date: 03/26/18   Next Dr.'s appt: to be scheduled     Subjective:   CC: left shoulder and thoracic pain     HPI: Pt reports onset of left shoulder pain beginning approximately 3 weeks ago after picking up child from high chair. Pt states that shoulder pain is more of a "dull irritating pain". Shoulder Pain is provoked with movement and use; pain is better with rest. Pt states he attempted to go to the gym and "work out his shoulder himself" by hanging on pull up bars and stretching which actually further irritated his pain. Pt also notes thoracic spine pain located midline. This dull pain is only present with extension of the spine or palpation; pt denies any pain with standing and sitting. Pt tried ibuprofen for pain relief, however this upset his stomach. Was prescribed Mobic- unsure if this has helped pain or not. Has not tried heat or ice.     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     Pain:  '[x]'  Yes  '[]'  No Location: L shoulder, T10-11  Spinous process  Pain Rating: (0-10 scale) L shoulder 4-5/10 with movement. Thoracic spine 3/10 with ext or palpation    Symptoms: '[x]'  Same  Better:  '[x]'  Lying   Worse: '[x]'  AM        '[x]'  Other: reaching   Sleep:     '[x]'  Disturbed d/t pain     Objective:     ROM  ??A/P END FEEL STRENGTH TESTS (+/-) Left Right Not Tested    Left Right  Left Right Drop Arm   '[]'    Sit Shld Flex Full   5 5 Sulcus Sign   '[]'    Sit Shld Abd Full   5 5 Apprehension   '[]'    Sit Shld IR T7   5 5 Yergasons   '[]'    Shoulder Flex      Speeds   '[]'    Ext      Neer +  '[]'    ABD  Hawkins  +  '[]'    ER @ 0 45 90 Full*   5 5 Painful Arc -  '[]'    IR      Tinel   '[]'    Supraspinatus      Yocum -     Infraspinatus            Serratus Ant            Pectoralis            Lats            Mid Trap            Lower Trap            Elbow Flex.    5 5       Elbow Ext.            Pronation            Supination            Wrist Flex.            Wrist Ext.            Rad. Dev.            Ulnar Dev.            Grip 66 lbs 77 lbs          * indicates pain    Abdominals 3/5  Full thoracic/lumbar ROM     OBSERVATION No Deficit Deficit Not Tested Comments   Forward Head '[]'  '[x]'  '[]'     Rounded Shoulders '[]'  '[x]'  '[]'     Kyphosis '[x]'  '[]'  '[]'     Scap Height/Position '[x]'  '[]'  '[]'     Winging '[x]'  '[]'  '[]'     SH Rhythm '[x]'  '[]'  '[]'     INSPECTION/PALPATION       SC/AC Joint '[x]'  '[]'  '[]'     Supraspinatus '[x]'  '[]'  '[]'     Biceps tendon/groove '[x]'  '[]'  '[]'     Posterior shld '[]'  '[x]'  '[]'  Superior medial border of scapula    Subscapularis '[x]'  '[]'  '[]'     NEUROLOGICAL       Cervical ROM/Quadrant '[x]'  '[]'  '[]'     Reflexes '[]'  '[]'  '[x]'     Compression/Distraction '[x]'  '[]'  '[]'     Sensation '[x]'  '[]'  '[]'         FUNCTION Normal Difficult Unable   Overhead reach '[]'  '[x]'  '[]'    Underarm reach  '[x]'  '[]'  '[]'    Groom/Dress '[]'  '[x]'  '[]'    Bra/Shirt tuck '[]'  '[x]'   '[]'    Lift/Carry '[]'  '[x]'  '[]'     '[]'  '[]'  '[]'      Comments: UEFS: 61/80, 24% impairment     Assessment:Pt presents to clinic with reports of acute L shoulder and thoracic pain. Injury occurred ~3 weeks ago with minimal improvement, likely because patient attempted inappropriate exercises for relief that actually caused further irritation. Pt's primary deficit is pain with all shoulder mobility and thoracic extension. Pt will benefit from physical therapy services in order to learn proper stretches and exercises for optimal functional mobility.     Problems:    '[x]'  ? Pain: 4-5/10 L shoulder pain with movement. 3/10 thoracic pain with extension.   '[x]'  ? Strength: weak abdominals   '[x]'  ? Function: reports 24% impairment on the UEFS  '[x]'  Other: Sleep disturbances     STG: (to be met in 4 treatments)  1. ? Pain: 0/10 average L shoulder and thoracic pain with movement for improved tolerance to ADL's  2. ?  Function:  a. Pt will report ability to don/doff clothing without increased pain for improved independence   b. Pt will report <20% impairment on the UEFS to indicate improved functional mobility   3. Independent with Home Exercise Programs  4. Demonstrate Knowledge of fall prevention  LTG: (to be met in 8 treatments)  1. Pt to demonstrate grossly 4/5 abdominal strength for improved thoracic and lumbar stability during lifting/carrying  2. Pt will report ability to lift, carry, transfer daughter without increased L shoulder pain for improved QOL  3. Pt will deny sleep disturbances d/t pain for improved sleep patterns   4. Pt to demonstrate proper mechanics for lifting from ground, waist, and chest height to decrease risk of re-injury                   Patient goals: decrease pain     Rehab Potential:  '[x]'  Good  '[]'  Fair  '[]'  Poor   Suggested Professional Referral:  '[x]'  No  '[]'  Yes:  Barriers to Goal Achievement:  '[x]'  No  '[]'  Yes:  Domestic Concerns:  '[x]'  No  '[]'  Yes:    Pt. Education:  '[x]'  Plans/Goals, Risks/Benefits discussed  '[x]'   Home exercise program  Method of Education: '[x]'  Verbal  '[x]'  Demo  '[x]'  Written  Comprehension of Education:  '[x]'  Verbalizes understanding.  '[x]'  Demonstrates understanding.  '[x]'  Needs Review.  '[]'  Demonstrates/verbalizes understanding of HEP/Ed previously given.    Treatment Plan:  '[x]'  Therapeutic Exercise   97110  '[]'  Iontophoresis: 4 mg/mL Dexamethasone Sodium Phosphate 40-120 mAmin  97033   '[x]'  Therapeutic Activity  97530 '[x]'  Vasopneumatic cold with compression  97016    '[]'  Gait Training   97116 '[]'  Ultrasound   97035   '[]'  Neuromuscular Re-education  97112 '[]'  Electrical Stimulation Unattended  97014   '[x]'  Manual Therapy  97140 '[]'  Electrical Stimulation Attended  97032   '[x]'  Instruction in HEP  '[]'  Dry Needling   '[]'  Aquatic Therapy   97113 '[x]'  Cold/hotpack    '[]'  Massage   97124      '[]'  Lumbar/Cervical Traction  97012     '[]'   Medication allergies reviewed for use of    Dexamethasone Sodium Phosphate 9m/ml     with iontophoresis treatments.   Pt is not allergic.       Frequency: 1-2 x/week for 8 visits    Today???s Treatment:  Modalities:   Precautions:  Exercises:  Exercise Reps/ Time Weight/ Level Comments   Manual: PA mobs to thoracic spine 5 min  Pain relief          Pendulum 2x30"     Wall slides: flex, abd 10x5"     Wall angel  10x           T-band      rows 10x lime    ER isometric walk out 10x lime          Open book stretch 10x5"           Other:    Specific Instructions for next treatment: continued thoracic and left shoulder stretching. Left shoulder stabilization with rhythmic stab, ball on wall dynamic stab, wall push ups, etc. Modalities as needed      Evaluation Complexity:  History (Personal factors, comorbidities) '[]'  0 '[x]'  1-2 '[]'  3+   Exam (limitations, restrictions) '[x]'  1-2 '[]'  3 '[]'  4+   Clinical presentation (progression) '[x]'  Stable '[]'  Evolving  '[]'  Unstable   Decision Making '[x]'  Low '[]'  Moderate '[]'  High    [  x] Low Complexity '[]'  Moderate Complexity '[]'  High Complexity       Treatment Charges: Mins Units    '[x]'  Evaluation       '[x]'   Low       '[]'   Moderate       '[]'   High 30 1   '[]'   Modalities     '[x]'   Ther Exercise 10 1   '[x]'   Manual Therapy 5 --   '[]'   Ther Activities     '[]'   Aquatics     '[]'   Vasocompression     '[]'   Other       TOTAL TREATMENT TIME: 45 minutes    Time in: 4:10 pm    Time Out: 5:00 pm    Electronically signed by: Beaulah Corin, PT        Physician Signature:________________________________Date:__________________  By signing above or cosigning this note, I have reviewed this plan of care and certify a need for medically necessary rehabilitation services.     *PLEASE SIGN ABOVE AND FAX BACK ALL PAGES*

## 2018-04-17 ENCOUNTER — Encounter

## 2018-04-20 NOTE — Other (Signed)
Xray is normal.

## 2018-04-23 ENCOUNTER — Inpatient Hospital Stay: Admit: 2018-04-23 | Discharge: 2018-04-23 | Payer: MEDICAID | Primary: Registered Nurse

## 2018-04-23 NOTE — Other (Signed)
'[]'  [place]  [address]  [phone]  F: [phone] '[x]'  [place]  [address]   [address]  P: [phone]  F: [phone] '[]'  [place]  [place]  P: [phone]  F: [phone] '[]'  [facility]  Outpatient Rehabilitation &  Therapy  [place]   [address]   [place]: [phone]  F: [phone]      Physical Therapy Daily Treatment Note    Date:  04/23/2018  Patient Name:  Jeffrey Zhang    DOB:  Jun 03, 1971  MRN: [id number]   Physician: Claudette Stapler, DO                                 Insurance: Jennefer Bravo, 20 visits   Medical Diagnosis:   M25.512 (ICD-10-CM) - Acute pain of left shoulder   S23.9XXA (ICD-10-CM) - Thoracic sprain   Rehab Codes: M25.512, M25.612, M54.6  Onset Date: 03/26/18             Next Dr.'s appt: to be scheduled   Visit# / total visits: 2/8  Cancels/No Shows: 0/0    Subjective:    Pain:  '[x]'  Yes  '[]'  No Location: L shoulder, T10-11  Spinous process Pain Rating: (0-10 scale) 0/10 at rest, 2-3/10 with movement   Pain altered Tx:  '[]'  No  '[]'  Yes  Action:  Comments: Pt states he has been completing HEP every other day, notes some improvement in pain. States he heard from his doctor that his x-rays were normal. Pt later states that he has been foam rolling his entire spine at the gym.     Objective:  Modalities: vasocompression to left shoulder x 15 minutes, moderate pressure   Precautions:  Exercises:  Exercise Reps/ Time Weight/ Level Comments         Pulleys 2/2           Manual: PA mobs to thoracic spine Not today ?? Pain relief    ?? ?? ?? ??   Pendulum PRN ?? PRN for discomfort throughout session   Wall slides: flex, abd 10x5" ?? ??   Wall angel  10x ?? ??   Wall push up plus 15x     Ball stab on wall 15xea yellow    Dynamic ball stab 2 x 30" Big redball    ?? ?? ?? ??   T-band ?? ??  ??moderate cueing required for form    Rows 15x lime ??   ER isometric walk out 15x lime ??   Shoulder ext 15x lime Added 11/21         Supine   Added 11/21   Rhythmic stab      SA punch 15x 2#    Chest fly 15x 2#    Thoracic ext over foam 5x     Supine over half foam for chest stretch 5x           Prone   Added 11/21   Row 10x 2#    Shoulder ext 10x 2#    T w/ full ER 10x A    ?? ?? ?? ??   Open book stretch 10x5" ?? ??   ?? ?? ?? ??  Other:    Specific Instructions for next treatment: continued thoracic and left shoulder stretching. Left shoulder stabilization with rhythmic stab, ball on wall dynamic stab, wall push ups, etc. Modalities as needed    Treatment Charges: Mins Units   '[]'   Modalities     '[x]'   Ther Exercise 40 3   '[]'   Manual Therapy     '[]'   Ther Activities     '[]'   Aquatics     '[x]'   Vasocompression 15 1   '[]'   Other     Total Treatment time 55 4       Assessment: '[x]'  Progressing toward goals. Pt requires moderate cueing throughout session to ensure proper form, especially for t-band exercises. Added foam roller stretches per pt request and he reports feeling a good, painless stretch. Pt notes some muscle fatigue/soreness throughout however reports that it is tolerable. Will continue to progress as tolerated; pt presenting like a rotator cuff tendinitis, thus will focus on isometric ER strengthening and progress to eccentrics/concentrics as able.     '[]'  No change.     '[]'  Other:    ??  STG: (to be met in 4 treatments)  1. ? Pain: 0/10 average L shoulder and thoracic pain with movement for improved tolerance to ADL's  2. ? Function:  a. Pt will report ability to don/doff clothing without increased pain for improved independence   b. Pt will report <20% impairment on the UEFS to indicate improved functional mobility   3. Independent with Home Exercise Programs  4. Demonstrate Knowledge of fall prevention  LTG: (to be met in 8 treatments)  1. Pt to demonstrate grossly 4/5 abdominal strength for improved thoracic and lumbar  stability during lifting/carrying  2. Pt will report ability to lift, carry, transfer daughter without increased L shoulder pain for improved QOL  3. Pt will deny sleep disturbances d/t pain for improved sleep patterns   4. Pt to demonstrate proper mechanics for lifting from ground, waist, and chest height to decrease risk of re-injury      Pt. Education:  '[x]'  Yes  '[]'  No  '[x]'  Reviewed Prior HEP/Ed  Method of Education: '[x]'  Verbal  '[x]'  Demo  '[]'  Written  Comprehension of Education:  '[x]'  Verbalizes understanding.  '[x]'  Demonstrates understanding.  '[]'  Needs review.  '[]'  Demonstrates/verbalizes HEP/Ed previously given.     Plan: '[x]'  Continue per plan of care.   '[]'  Other:      Time In: 5:45 pm           Time Out: 6:45 pm    Electronically signed by:  Beaulah Corin, PT

## 2018-04-28 NOTE — Other (Signed)
[]   [facility]  [facility] &  The[facility]  [address]    [phone]  F: [phone]   [x]  [facility]  Outpatient Rehabilitation &  Therapy  [address]   [address]  P: [phone]  F: [phone]  []  [facility]  Outpatient Rehabilitation &  Therapy  [address]  P: [phone]  F: [phone]  []  [facility] [place]  Outpatient Rehabilitation &  Therapy  [address]   [address]   [place]: [phone]  F: [phone]   []  [facility]   Outpatient Rehabilitation & Therapy  [address]  [place]: [phone]   F: [phone]     Physical Therapy Cancel/No Show note    Date: 04/28/2018  Patient: Jeffrey Zhang  DOB: [birth year]  MRN: [id number]    Cancels/No Shows to date: 0/1    For today's appointment patient:    []   Cancelled    []  Rescheduled appointment    [x]  No-show     Reason given by patient:    []   Patient ill    []   Conflicting appointment    []  No transportation      []  Conflict with work    []  No reason given    []  Weather related    [x]  Other: pt confused about appt time     Comments:        [x]  Next appointment was confirmed    Electronically signed by: Artemio AlyMatthew J Briselda Naval, PTA

## 2018-04-29 ENCOUNTER — Inpatient Hospital Stay: Payer: MEDICAID | Primary: Registered Nurse

## 2018-05-05 DIAGNOSIS — M25512 Pain in left shoulder: Secondary | ICD-10-CM

## 2018-05-05 NOTE — Other (Signed)
[]   [facility]  Outpatient [facility] &  Therapy  [address]    [phone]  F: [phone]   [x]  [facility]  Outpatient Rehabilitation &  Therapy  [address]   [address]  P: [phone]  F: [phone]  []  [facility]  Outpatient Rehabilitation &  Therapy  [address]  P: [phone]  F: [phone]  []  [facility] [place]  Outpatient [facility] &  Therapy  [address]   [address]   [place]: [phone]  F: [phone]   []  [facility]   Outpatient Rehabilitation & Therapy  [address]  [place]: [phone]   F: [phone]     Physical Therapy Cancel/No Show note    Date: 05/05/2018  Patient: Jeffrey Zhang  DOB: [birth year]  MRN: [id number]    Cancels/No Shows to date: 1/1    For today's appointment patient:    [x]   Cancelled    []  Rescheduled appointment    []  No-show     Reason given by patient:    []   Patient ill    []   Conflicting appointment    []  No transportation      []  Conflict with work    [x]  No reason given    []  Weather related    []  Other:      Comments:        [x]  Next appointment was confirmed    Electronically signed by: Artemio AlyMatthew J Kenyona Rena, PTA

## 2018-05-06 ENCOUNTER — Inpatient Hospital Stay: Payer: MEDICAID | Primary: Registered Nurse

## 2018-05-07 ENCOUNTER — Inpatient Hospital Stay: Admit: 2018-05-07 | Discharge: 2018-05-07 | Payer: MEDICAID | Primary: Registered Nurse

## 2018-05-07 NOTE — Other (Signed)
'[]'  [place]  [address]  [phone]  F: [phone] '[x]'  [place]  [address]   [address]  P: [phone]  F: [phone] '[]'  [place]  [place]  P: [phone]  F: [phone] '[]'  [facility]  Outpatient Rehabilitation &  Therapy  [place]   [address]   [place]: [phone]  F: [phone]      Physical Therapy Daily Treatment Note    Date:  05/07/2018  Patient Name:  Jeffrey Zhang    DOB:  Jun 14, 1970  MRN: [id number]   Physician: Claudette Stapler, DO                                 Insurance: Jennefer Bravo, 20 visits   Medical Diagnosis:   M25.512 (ICD-10-CM) - Acute pain of left shoulder   S23.9XXA (ICD-10-CM) - Thoracic sprain   Rehab Codes: M25.512, M25.612, M54.6  Onset Date: 03/26/18             Next Dr.'s appt: to be scheduled   Visit# / total visits: 3/8  Cancels/No Shows: 2/0    Subjective:    Pain:  '[x]'  Yes  '[]'  No Location: L shoulder, T10-11  Spinous process Pain Rating: (0-10 scale) 0/10 at rest, 2-3/10 with movement   Pain altered Tx:  '[]'  No  '[]'  Yes  Action:  Comments: Pt admits he has not been completing HEP as prescribed. Notes that the L shoulder and thoracic spine are feeling better.     Objective:  Modalities: vasocompression to left shoulder x 10 minutes, min pressure   Precautions:  Exercises:  Exercise Reps/ Time Weight/ Level Comments   UBE 2/2     Pulleys Not today           Manual: PA mobs to thoracic spine Not today ?? Pain relief    ?? ?? ?? ??   Pendulum HEP ?? PRN for discomfort throughout session   Wall slides: flex, abd 10x5" ?? ??   Wall angel  10x ?? ??   Wall push up plus 15x lime Band added 12/5   Ball stab on wall 15xea yellow    Dynamic ball stab 2 x 30" Big redball    Lateral raise 10x2 3# Added 12/5   Front raise 10x2 3# Added 12/5   ?? ?? ?? ??   T-band ?? ?? ??moderate  cueing required for form    Rows 15x lime ??   ER isometric walk out 15x blueberry ??   Shoulder ext 15x lime    High row 15x lime Added 12/05         Supine   Added 11/21   Rhythmic stab      SA punch 15x 3# Increased 12/05   Chest fly 15x 3# Increased 12/05   Chest press 15x 3# Added 12/05   Thoracic ext over foam 5x     Supine over half foam for chest stretch 5x           Prone      Row 10x 2# Increased 12/05   Shoulder ext 15x 3# Increased 12/05   T w/ full ER 10x A    ?? ?? ?? ??  Open book stretch HEP  ??   ?? ?? ?? ??   Other:    Specific Instructions for next treatment: continue Left shoulder strengthening and progress as appropriate     Treatment Charges: Mins Units   '[]'   Modalities     '[x]'   Ther Exercise 40 3   '[]'   Manual Therapy     '[]'   Ther Activities     '[]'   Aquatics     '[x]'   Vasocompression 10 1   '[]'   Other     Total Treatment time 50 4       Assessment: '[x]'  Progressing toward goals. Improved carry over of form noted with t-band exercises. Able to add lateral and front raises without reports of pain. Muscular fatigue reported at conclusion. Will continue to progress as tolerated.     '[]'  No change.     '[]'  Other:    ??  STG: (to be met in 4 treatments)  1. ? Pain: 0/10 average L shoulder and thoracic pain with movement for improved tolerance to ADL's  2. ? Function:  a. Pt will report ability to don/doff clothing without increased pain for improved independence   b. Pt will report <20% impairment on the UEFS to indicate improved functional mobility   3. Independent with Home Exercise Programs  4. Demonstrate Knowledge of fall prevention  LTG: (to be met in 8 treatments)  1. Pt to demonstrate grossly 4/5 abdominal strength for improved thoracic and lumbar stability during lifting/carrying  2. Pt will report ability to lift, carry, transfer daughter without increased L shoulder pain for improved QOL  3. Pt will deny sleep disturbances d/t pain for improved sleep patterns   4. Pt to demonstrate proper mechanics for  lifting from ground, waist, and chest height to decrease risk of re-injury      Pt. Education:  '[x]'  Yes  '[]'  No  '[x]'  Reviewed Prior HEP/Ed  Method of Education: '[x]'  Verbal  '[x]'  Demo  '[]'  Written  Comprehension of Education:  '[x]'  Verbalizes understanding.  '[x]'  Demonstrates understanding.  '[]'  Needs review.  '[]'  Demonstrates/verbalizes HEP/Ed previously given.     Plan: '[x]'  Continue per plan of care.   '[]'  Other:      Time In: 6:00 pm           Time Out: 6: 55 pm    Electronically signed by:  Beaulah Corin, PT

## 2018-05-12 ENCOUNTER — Encounter: Payer: MEDICAID | Primary: Registered Nurse

## 2018-05-14 ENCOUNTER — Inpatient Hospital Stay: Payer: MEDICAID | Primary: Registered Nurse

## 2018-05-14 NOTE — Other (Signed)
[]   [facility]  Out[facility] &  Therapy  [address]    [phone]  F: [phone]   [x]  [facility]  Outpatient Rehabilitation &  Therapy  [address]   [address]  P: [phone]  F: [phone]  []  [facility]  Outpatient Rehabilitation &  Therapy  [address]  P: [phone]  F: [phone]  []  [facility] [place]  [facility]patient [facility] &  Therapy  [address]   [address]   [place]: [phone]  F: [phone]   []  [facility]   Outpatient Rehabilitation & Therapy  [address]  [place]: [phone]   F: [phone]     Physical Therapy Cancel/No Show note    Date: 05/15/2018  Patient: Jeffrey Zhang  DOB: 01/05/1971  MRN: [id number]    Cancels/No Shows to date: 1/2    For today's appointment patient:    []   Cancelled    []  Rescheduled appointment    [x]  No-show     Reason given by patient:    []   Patient ill    []   Conflicting appointment    []  No transportation      []  Conflict with work    []  No reason given    []  Weather related    [x]  Other:      Comments:  Unable to reach patient as phone line is currently disconnected. Will pull from schedule if patient no-shows for next visit.       []  Next appointment was confirmed    Electronically signed by: Eliberto IvoryKatherine Kynsley Whitehouse, PT

## 2018-05-19 ENCOUNTER — Inpatient Hospital Stay: Payer: MEDICAID | Primary: Registered Nurse

## 2018-05-19 NOTE — Other (Signed)
[]   [facility]  Outpatient [facility] &  Therapy  [address]    [phone]  F: [phone]   [x]  [facility]  Outpatient Rehabilitation &  Therapy  [address]   [address]  P: [phone]  F: [phone]  []  [facility]  Outpatient Rehabilitation &  Therapy  [address]  P: [phone]  F: [phone]  []  [facility] [place]  Outpatient Rehabilitation &  Therapy  [address]   [address]   [place]: [phone]  F: [phone]   []  [facility]   Outpatient Rehabilitation & Therapy  [address]  [place]: [phone]   F: [phone]     Physical Therapy Cancel/No Show note    Date: 05/19/2018  Patient: Jeffrey Zhang  DOB: 12/09/1970  MRN: [id number]    Cancels/No Shows to date: 2/2    For today's appointment patient:    [x]   Cancelled    []  Rescheduled appointment    []  No-show     Reason given by patient:    []   Patient ill    []   Conflicting appointment    []  No transportation      []  Conflict with work    [x]  No reason given    []  Weather related    []  Other:      Comments:      [x]  Next appointment was confirmed    Electronically signed by: Pincus LargeAlexi Caeson Filippi, PTA

## 2018-05-21 ENCOUNTER — Inpatient Hospital Stay: Payer: MEDICAID | Primary: Registered Nurse

## 2018-05-21 NOTE — Other (Signed)
[]   [facility]  Outpatient [facility] &  Therapy  [address]    [phone]  F: [phone]   [x]  [facility]  Outpatient Rehabilitation &  Therapy  [address]   [address]  P: [phone]  F: [phone]  []  [facility]  Outpatient Rehabilitation &  Therapy  [address]  P: [phone]  F: [phone]  []  [facility] [place]  Outpatient Rehabilitation &  Therapy  [address]   [address]   [place]: [phone]  F: [phone]   []  [facility]   Outpatient Rehabilitation & Therapy  [address]  [place]: [phone]   F: [phone]     Physical Therapy Cancel/No Show note    Date: 05/21/2018  Patient: Jeffrey Zhang  DOB: 12-28-1970  MRN: [id number]    Cancels/No Shows to date: 3/2    For today's appointment patient:    [x]   Cancelled    []  Rescheduled appointment    []  No-show     Reason given by patient:    []   Patient ill    []   Conflicting appointment    []  No transportation      []  Conflict with work    [x]  No reason given    []  Weather related    []  Other:      Comments:  Pt is not scheduled past this date      []  Next appointment was confirmed    Electronically signed by: Waynard ReedsKATHERINE Dorthea Maina, PTA

## 2018-05-21 NOTE — Discharge Summary (Signed)
[]   [facility]        Outpatient Physical                Therapy       [address]       Phone: [phone]       Fax: [phone] [x]  [facility]       [facility]       [address]          [address]       Phone: [phone]       Fax: [phone] []  [facility]     [address]     Phone: [phone]     Fax:  [phone]     Physical Therapy Discharge Note    Date: 05/25/2018      Patient: Jeffrey Zhang  DOB: August 07, 1970  MRN: [id number]    Physician:??Stacy L Bowen, DO??????????????????????????????????????????????????????????????????Insurance: CavaleroBUCKEYE, 20 visits??  Medical Diagnosis:??  M25.512 (ICD-10-CM) - Acute pain of left shoulder   S23.9XXA (ICD-10-CM) - Thoracic sprain   Rehab Codes:??M25.512, M25.612, M54.6  Onset Date:??03/26/18??????????????????????????Next Dr.'s appt: to be scheduled??  Visit# / total visits: 3/8                      Cancels/No Shows: 3/2  Date of initial visit: 04/16/18                Date of final visit: 05/07/18       Discharge Status:     Pt with 3 cancels and 2 no-shows, patient is now discharged per attendance policy           Electronically signed by: Eliberto IvoryKatherine Canaan Holzer, PT    If you have any questions or concerns, please don't hesitate to call.  Thank you for your referral.

## 2019-04-14 ENCOUNTER — Ambulatory Visit
Admit: 2019-04-14 | Discharge: 2019-04-14 | Payer: PRIVATE HEALTH INSURANCE | Attending: Registered Nurse | Primary: Registered Nurse

## 2019-04-14 DIAGNOSIS — M25511 Pain in right shoulder: Secondary | ICD-10-CM

## 2019-04-14 MED ORDER — TRIAMCINOLONE ACETONIDE 0.1 % EX CREA
0.1 | CUTANEOUS | 3 refills | Status: DC
Start: 2019-04-14 — End: 2022-03-27

## 2019-04-14 MED ORDER — DICLOFENAC SODIUM 1 % EX GEL
1 | Freq: Two times a day (BID) | CUTANEOUS | 3 refills | Status: DC
Start: 2019-04-14 — End: 2022-03-27

## 2019-04-14 NOTE — Progress Notes (Signed)
Alleen Borne, FNP-C  [place]  [phone] [place]  [place], [place]  [phone]  [phone]    Jeffrey Zhang is a 48 y.o. male who is here with c/o of:    Chief Complaint: New Patient; Joint Pain (shoulders); and Rash (rt leg)      Patient Accompanied by: n/a    HPI - Jeffrey Zhang is here today to establish care and the following complaints:    Joint pain   - bilateral shoulders, left proximal thumb, right proximal index finger    - started about 2 months ago   - decreased strength more so to the left   - he has taken OTC ibuprofen and this has not helped   - activity worsens symptoms    Rash   - right anterior lower leg   - noticed greater than 1 month ago   - is dry, not pruritic, denies erythema or swelling to or around the area   - he has not tried anything for this problem    He is also c/o fatigue that has been occurring for about the same amount of time  He reports that he was routinely working out at the gym, but since [place], has not been able to   He reports about 1-2 weeks ago, he did heavy yard work and has noticed increased symptoms since that time.    There is no problem list on file for this patient.     History reviewed. No pertinent past medical history.   History reviewed. No pertinent surgical history.  Family History   Problem Relation Age of Onset   ??? High Blood Pressure Father      Social History     Tobacco Use   ??? Smoking status: Never Smoker   ??? Smokeless tobacco: Never Used   Substance Use Topics   ??? Alcohol use: Never     Frequency: Never     ALLERGIES:    Allergies   Allergen Reactions   ??? Pcn [Penicillins]           Subjective     ?? Constitutional:  Negative for activity change, appetite change,unexpected weight change, chills, fever, and fatigue.    ?? HENT: Negative for ear pain, sore throat,  Rhinorrhea, sinus pain, sinus pressure, congestion.    ?? Eyes:  Negative for pain and discharge.   ?? Respiratory:  Negative for chest tightness, shortness  of breath, wheezing, and cough.    ?? Cardiovascular:  Negative for chest pain, palpitations and leg swelling.   ?? Gastrointestinal: Negative for abdominal pain, blood in stool, constipation,diarrhea, nausea and vomiting.   ?? Endocrine: Negative for cold intolerance, heat intolerance, polydipsia, polyphagia and polyuria.   ?? Genitourinary: Negative for difficulty urinating, dysuria, flank pain, frequency, hematuria and urgency.   ?? Musculoskeletal: Negative for back pain, joint swelling, myalgias, neck pain and neck stiffness. Positive for arthralgias  ?? Skin: Negative for wound. Positive for rash  ?? Allergic/Immunologic: Negative for environmental allergies and food allergies.   ?? Neurological:  Negative for dizziness, light-headedness, numbness and headaches.   ?? Hematological:  Negative for adenopathy. Does not bruise/bleed easily.   ?? Psychiatric/Behavioral: Negative for self-injury, sleep disturbance and suicidal ideas.     Objective     PHYSICAL EXAM:   ?? Constitutional: Jeffrey Zhang is oriented to person, place, and time. Vital signs are normal. Appears well-developed and well-nourished.   ?? HEENT:   ?? Head: Normocephalic  and atraumatic.   Right Ear: Hearing and external ear normal. TM normal  Canal normal  ?? Left Ear: Hearing and external ear normal. TM normal Canal normal  ?? Nose: Nares normal. Septum midline. No drainage or sinus tenderness. Mucosa pink and moist  ?? Mouth/Throat: Oropharynx- No erythema, no exudate.  Uvula midline, no erythema, no edema.  Mucous membranes are pink and moist. Good oral hygiene  ?? Eyes:PERRL, EOMI, Conjunctiva normal, No discharge.    ?? Neck: Full passive range of motion. Non-tender on palpation. Neck supple. No thyromegaly present. Trachea normal.  ?? Cardiovascular: Normal rate, regular rhythm, S1, S2, no murmur, no gallop, no friction rub, intact distal pulses.    ?? Pulmonary/Chest: Breath sounds are clear throughout, No respiratory distress, No wheezing, No chest tenderness.  Effort normal  Musculoskeletal: Physical Exam  Musculoskeletal:      Right shoulder: He exhibits pain and decreased strength (mild). He exhibits normal range of motion, no tenderness, no bony tenderness, no swelling, no effusion, no crepitus, no deformity and no spasm.      Left shoulder: He exhibits decreased strength (moderate). He exhibits normal range of motion, no tenderness, no bony tenderness, no swelling, no effusion, no crepitus, no deformity, no pain and no spasm.        Arms:       Right hand: He exhibits normal range of motion, no tenderness, no bony tenderness, normal capillary refill, no deformity and no swelling. Normal sensation noted. Normal strength noted.      Left hand: He exhibits normal range of motion, no tenderness, no bony tenderness, normal capillary refill, no deformity and no swelling. Normal sensation noted. Normal strength noted.        Hands:      ?? Lymphadenopathy: No lymphadenopathy noted.   ?? Neurological: Alert and oriented to person, place, and time. Normal motor function, Normal sensory function, No focal deficits noted.   He has normal strength.  ?? Skin: Skin is warm, dry and intact. No obvious lesions on exposed skin  ?? Psychiatric: Normal mood and affect. Speech is normal and behavior is normal.     Nursing note and vitals reviewed.  Blood pressure 117/80, pulse 73, temperature 97.9 ??F (36.6 ??C), temperature source Temporal, weight 201 lb 9.6 oz (91.4 kg), SpO2 97 %.  Body mass index is 27.34 kg/m??.    Wt Readings from Last 3 Encounters:   04/14/19 201 lb 9.6 oz (91.4 kg)   04/13/18 193 lb (87.5 kg)     BP Readings from Last 3 Encounters:   04/14/19 117/80   04/13/18 111/70       No results found for this visit on 04/14/19.    Completed Orders/Prescriptions   Orders Placed This Encounter   Medications   ??? triamcinolone (KENALOG) 0.1 % cream     Sig: Apply topically 2 times daily.     Dispense:  1 Tube     Refill:  3   ??? diclofenac sodium (VOLTAREN) 1 % GEL     Sig: Apply  topically 2 times daily     Dispense:  50 g     Refill:  3               AssessmentPlan/Medical Decision Making     1. Pain of both shoulder joints  - otc tylenol/ibuprofen as needed for pain  - CBC With Auto Differential; Future  - Comprehensive Metabolic Panel; Future  - ANA; Future  - Sedimentation Rate; Future  -  C-Reactive Protein; Future  - XR SHOULDER LEFT (MIN 2 VIEWS); Future  - XR SHOULDER RIGHT (MIN 2 VIEWS); Future    2. Polyarthralgia  - diclofenac sodium (VOLTAREN) 1 % GEL; Apply topically 2 times daily  Dispense: 50 g; Refill: 3    3. Weakness  - patient is concerned for rheumatoid arthritis, will r/o  - CBC With Auto Differential; Future  - Comprehensive Metabolic Panel; Future  - ANA; Future  - Sedimentation Rate; Future  - C-Reactive Protein; Future    4. Eczema, unspecified type  - triamcinolone (KENALOG) 0.1 % cream; Apply topically 2 times daily.  Dispense: 1 Tube; Refill: 3    5. Thyroid disorder screen  - TSH; Future  - T4, Free; Future    6. Screening cholesterol level  - Lipid, Fasting; Future    7. Encounter for medical examination to establish care      Return in about 2 weeks (around 04/28/2019) for review dx testing.    1.  Jeffrey Zhang received counseling on the following healthy behaviors: nutrition, exercise and medication adherence  2.  Patient given educational materials - see patient instructions  3.  Was a self-tracking handout given in paper form or via MyChart? No  If yes, see orders or list here.  4.  Discussed use, benefit, and side effects of prescribed medications.  Barriers to medication compliance addressed.  All patient questions answered.  Pt voiced understanding.   5.  Reviewed prior labs, imaging, consultation, follow up, and health maintenance  6.  Continue current medications, diet and exercise.  7. Discussed use, benefit, and side effects of prescribed medications.  Barriers to medication compliance addressed.  All her questions were answered.  Pt voiced understanding.  Jeffrey Zhang will continue current medications, diet and exercise.      Of the 45 minute duration appointment visit, Karena Addison, CNP spent at least 50% of the face-to-face time in counseling, explanation of diagnosis, planning of further management, and answering all questions.    Signed:  Karena Addison, CNP    This note is created with the assistance of a speech-recognition program.  While intending to generate a document that actually reflects the content of the visit, no guarantees can be provided that every mistake has been identified and corrected by editing.

## 2019-04-15 LAB — CBC WITH AUTO DIFFERENTIAL
Basophils %: 0.2 %
Basophils Absolute: 0 /??L
Eosinophils %: 1 %
Eosinophils Absolute: 0 /??L
Hematocrit: 44.3 % (ref 41–53)
Hemoglobin: 14.9 g/dL (ref 13.5–17.5)
Lymphocytes %: 38 %
Lymphocytes Absolute: 1.8 /??L
MCH: 29.6 pg
MCHC: 33.7 g/dL
MCV: 88 fL
MPV: 9.1 fL
Monocytes %: 8.5 %
Monocytes Absolute: 0.4 /??L
Neutrophils %: 52.3 %
Neutrophils Absolute: 2.4 /??L
Platelets: 191 K/??L
RBC: 5.04 10^6/??L
RDW: 13.9 %
WBC: 4.7 10^3/mL

## 2019-04-15 LAB — TSH: TSH: 1.11 u[IU]/mL

## 2019-04-15 LAB — COMPREHENSIVE METABOLIC PANEL
ALT: 28 U/L
AST: 25 U/L
Albumin: 4.5
Alkaline Phosphatase: 78 U/L
Anion Gap: 10 mmol/L
BUN: 14 mg/dL
CO2: 28 mmol/L
Calcium: 9.9 mg/dL
Chloride: 104 mmol/L
Creatinine: 0.99
Gfr Calculated: 60
Glucose: 97 mg/dL
Potassium: 4.5 mmol/L
Sodium: 142 mmol/L
Total Bilirubin: 0.8 mg/dL (ref 0.1–1.4)
Total Protein: 7.2

## 2019-04-15 LAB — ANA: ANA Titer: NEGATIVE

## 2019-04-15 LAB — C-REACTIVE PROTEIN: CRP: 0.1

## 2019-04-15 LAB — T4, FREE: T4 Free: 1.01

## 2019-04-15 LAB — LIPID, FASTING
Cholesterol, Fasting: 267
HDL: 43 mg/dL (ref 35–70)
LDL Calculated: 191 mg/dL — AB (ref 0–160)
Triglyceride, Fasting: 163

## 2019-04-15 LAB — SEDIMENTATION RATE: Sed Rate: 10

## 2019-04-19 ENCOUNTER — Encounter

## 2019-04-22 ENCOUNTER — Encounter

## 2019-04-26 ENCOUNTER — Ambulatory Visit
Admit: 2019-04-26 | Discharge: 2019-04-26 | Payer: PRIVATE HEALTH INSURANCE | Attending: Registered Nurse | Primary: Registered Nurse

## 2019-04-26 DIAGNOSIS — M25511 Pain in right shoulder: Secondary | ICD-10-CM

## 2019-04-26 NOTE — Progress Notes (Signed)
Alleen Borne, FNP-C  [facility]  [phone] [place]. [address]  [place], [place]  [phone]  [phone]    Jeffrey Zhang is a 48 y.o. male who is here with c/o of:    Chief Complaint: Discuss Labs      Patient Accompanied by: n/a    HPI - Jeffrey Zhang is here today to establish care and the following complaints:    Joint pain   - bilateral shoulders, left proximal thumb, right proximal index finger    - started several months ago - has been referred to PT in the past, but d/t work, has not been able to go   - decreased strength more so to the left   - he has taken OTC ibuprofen and this has not helped, he reports minimal relief from Voltaren Gel prescribed at last visit   - activity worsens symptoms      There is no problem list on file for this patient.     No past medical history on file.   No past surgical history on file.  Family History   Problem Relation Age of Onset   ??? High Blood Pressure Father      Social History     Tobacco Use   ??? Smoking status: Never Smoker   ??? Smokeless tobacco: Never Used   Substance Use Topics   ??? Alcohol use: Never     Frequency: Never     ALLERGIES:    Allergies   Allergen Reactions   ??? Pcn [Penicillins]           Subjective     ?? Constitutional:  Negative for activity change, appetite change,unexpected weight change, chills, fever, and fatigue.    ?? HENT: Negative for ear pain, sore throat,  Rhinorrhea, sinus pain, sinus pressure, congestion.    ?? Eyes:  Negative for pain and discharge.   ?? Respiratory:  Negative for chest tightness, shortness of breath, wheezing, and cough.    ?? Cardiovascular:  Negative for chest pain, palpitations and leg swelling.   ?? Gastrointestinal: Negative for abdominal pain, blood in stool, constipation,diarrhea, nausea and vomiting.   ?? Endocrine: Negative for cold intolerance, heat intolerance, polydipsia, polyphagia and polyuria.   ?? Genitourinary: Negative for difficulty urinating, dysuria, flank pain, frequency,  hematuria and urgency.   ?? Musculoskeletal: Negative for back pain, joint swelling, myalgias, neck pain and neck stiffness. Positive for arthralgias  ?? Skin: Negative rash and wound.  ?? Allergic/Immunologic: Negative for environmental allergies and food allergies.   ?? Neurological:  Negative for dizziness, light-headedness, numbness and headaches.   ?? Hematological:  Negative for adenopathy. Does not bruise/bleed easily.   ?? Psychiatric/Behavioral: Negative for self-injury, sleep disturbance and suicidal ideas.     Objective     PHYSICAL EXAM:   ?? Constitutional: Jeffrey Zhang is oriented to person, place, and time. Vital signs are normal. Appears well-developed and well-nourished.   ?? HEENT:   ?? Head: Normocephalic and atraumatic.   Right Ear: Hearing and external ear normal.   ?? Left Ear: Hearing and external ear normal.   ?? Eyes:PERRL, EOMI, Conjunctiva normal, No discharge.    ?? Neck: Full passive range of motion. Non-tender on palpation. Neck supple. No thyromegaly present. Trachea normal.  ?? Cardiovascular: Normal rate, regular rhythm, S1, S2, no murmur, no gallop, no friction rub, intact distal pulses.    ?? Pulmonary/Chest: Breath sounds are clear throughout, No respiratory distress, No wheezing, No chest tenderness.  Effort normal  Musculoskeletal: Physical Exam  Musculoskeletal:      Right shoulder: He exhibits pain and decreased strength (mild). He exhibits normal range of motion, no tenderness, no bony tenderness, no swelling, no effusion, no crepitus, no deformity and no spasm.      Left shoulder: He exhibits decreased strength (moderate). He exhibits normal range of motion, no tenderness, no bony tenderness, no swelling, no effusion, no crepitus, no deformity, no pain and no spasm.        Arms:       Right hand: He exhibits normal range of motion, no tenderness, no bony tenderness, normal capillary refill, no deformity and no swelling. Normal sensation noted. Normal strength noted.      Left hand: He exhibits  normal range of motion, no tenderness, no bony tenderness, normal capillary refill, no deformity and no swelling. Normal sensation noted. Normal strength noted.        Hands:      ?? Lymphadenopathy: No lymphadenopathy noted.   ?? Neurological: Alert and oriented to person, place, and time. Normal motor function, Normal sensory function, No focal deficits noted.   He has normal strength.  ?? Skin: Skin is warm, dry and intact. No obvious lesions on exposed skin  ?? Psychiatric: Normal mood and affect. Speech is normal and behavior is normal.     Nursing note and vitals reviewed.  Blood pressure 118/74, pulse 79, temperature 98.1 ??F (36.7 ??C), temperature source Temporal, height 6' (1.829 m), weight 205 lb 9.6 oz (93.3 kg), SpO2 98 %.  Body mass index is 27.88 kg/m??.    Wt Readings from Last 3 Encounters:   04/26/19 205 lb 9.6 oz (93.3 kg)   04/14/19 201 lb 9.6 oz (91.4 kg)   04/13/18 193 lb (87.5 kg)     BP Readings from Last 3 Encounters:   04/26/19 118/74   04/14/19 117/80   04/13/18 111/70       No results found for this visit on 04/26/19.    Completed Orders/Prescriptions   No orders of the defined types were placed in this encounter.              AssessmentPlan/Medical Decision Making     1. Pain of both shoulder joints  - encouraged use of Voltaren gel  - encouraged PT  - inflammatory markers and xrays negative  - External Referral To Physical Therapy        Return in about 1 month (around 05/26/2019) for shoulder pain.    1.  Jeffrey Zhang received counseling on the following healthy behaviors: nutrition, exercise and medication adherence  2.  Patient given educational materials - see patient instructions  3.  Was a self-tracking handout given in paper form or via MyChart? No  If yes, see orders or list here.  4.  Discussed use, benefit, and side effects of prescribed medications.  Barriers to medication compliance addressed.  All patient questions answered.  Pt voiced understanding.   5.  Reviewed prior labs, imaging,  consultation, follow up, and health maintenance  6.  Continue current medications, diet and exercise.  7. Discussed use, benefit, and side effects of prescribed medications.  Barriers to medication compliance addressed.  All her questions were answered.  Pt voiced understanding. Jeffrey Zhang will continue current medications, diet and exercise.      Of the 15 minute duration appointment visit, Karena Addison, CNP spent at least 50% of the face-to-face time in counseling, explanation of diagnosis, planning of further management,  and answering all questions.    Signed:  Alleen Borne, CNP    This note is created with the assistance of a speech-recognition program.  While intending to generate a document that actually reflects the content of the visit, no guarantees can be provided that every mistake has been identified and corrected by editing.

## 2019-05-07 ENCOUNTER — Encounter: Attending: Registered Nurse | Primary: Registered Nurse

## 2019-05-24 ENCOUNTER — Encounter: Attending: Registered Nurse | Primary: Registered Nurse

## 2019-06-15 ENCOUNTER — Encounter: Payer: PRIVATE HEALTH INSURANCE | Attending: Registered Nurse | Primary: Registered Nurse

## 2019-08-30 NOTE — Telephone Encounter (Signed)
Referral faxed to given number

## 2019-08-30 NOTE — Telephone Encounter (Signed)
Hello patient called and requested a new order for physical therapy. He said the old one is no longer available. Please advise if can get a new approval for the order. Patient said he has an appt tomorrow but I told him depending on time that [employer] receives message would be when it would be approval or someone will advise otherwise. He said he would follow up later this afternoon. Please advise. Thanks:)    Fax number for the physical therapy office. #[phone]    Patients phone number: #[phone]

## 2020-11-06 LAB — QUANTIFERON (R) TB GOLD
QuantiFERON Mitogen: >10 IU/mL
QuantiFERON Nil: 0.03 IU/mL
Quantiferon TB Minus NIL: NEGATIVE
Quantiferon TB1 Minus NIL: 0.05 IU/mL (ref 0.00–0.34)
Quantiferon TB2 Minus NIL: 0.07 IU/mL (ref 0.00–0.34)

## 2020-12-21 NOTE — Telephone Encounter (Signed)
Subjective: Caller states "I have been to [place], [place], just got back. "     Current Symptoms: Productive cough- clear mucus, chest congestion.    Chest pain when coughing.    Denies SOB, runny nose, sore throat, fever, N/V, abdominal, loss of taste and smell. Headache    Onset: 2 -3 days ago; gradual    Associated Symptoms: NA    Pain Severity: 0/10; N/A; none    Temperature: none     What has been tried: nyquil, dayquil    LMP: NA Pregnant: NA    Recommended disposition: Home Care    Care advice provided, patient verbalizes understanding; denies any other questions or concerns; instructed to call back for any new or worsening symptoms.    Going  to do home COVID test.    Attention Provider:  Thank you for allowing me to participate in the care of your patient.  The patient was connected to triage in response to symptoms provided.   Please do not respond through this encounter as the response is not directed to a shared pool.      Reason for Disposition   [1] COVID-19 infection suspected by caller or triager AND [2] mild symptoms (cough, fever, or others) AND [3] has not gotten tested yet    Protocols used: Coronavirus (COVID-19) Diagnosed or Suspected-ADULT-AH

## 2022-03-27 ENCOUNTER — Ambulatory Visit
Admit: 2022-03-27 | Discharge: 2022-03-27 | Payer: BLUE CROSS/BLUE SHIELD | Attending: Student in an Organized Health Care Education/Training Program | Primary: Student in an Organized Health Care Education/Training Program

## 2022-03-27 ENCOUNTER — Inpatient Hospital Stay: Payer: BLUE CROSS/BLUE SHIELD | Primary: Student in an Organized Health Care Education/Training Program

## 2022-03-27 DIAGNOSIS — Z130 Encounter for screening for diseases of the blood and blood-forming organs and certain disorders involving the immune mechanism: Secondary | ICD-10-CM

## 2022-03-27 DIAGNOSIS — Z0000 Encounter for general adult medical examination without abnormal findings: Secondary | ICD-10-CM

## 2022-03-27 NOTE — Assessment & Plan Note (Addendum)
Physical exam: Stable  Diet: Advised high fiber diet of 20 gram a day by increasing fresh fruits, vegetables and nuts.  Advised decreasing fats in diet including red meat and processed red meats, refined carbohydrates, and sweetened beverages.   Exercise: Advised moderate intensity exercise (brisk walking) 150 minutes a week or at least 5,000-10,000 steps a day.  Sleep: Advised good sleep habits including turning tv and phone off. 6-8 hours advised.

## 2022-03-27 NOTE — Progress Notes (Addendum)
[facility]  [facility]  [address]  [place] [address]     Date of Visit:  03/27/2022  Patient Name: Jeffrey Zhang   Patient DOB:  12-02-1970     CHIEF COMPLAINT:     Jeffrey Zhang is a 51 y.o. male who presents today for an general visit to be evaluated for the following condition(s):  Chief Complaint   Patient presents with    Establish Care     Previous pcp Beth Schott       REVIEW OF SYSTEM      Review of Systems   All other systems reviewed and are negative.      HISTORY OF PRESENT ILLNESS     Patient is a 51 year old male with no past medical history presents to the office for complete physical exam.    Diet:  Patient states that he eats meat including red meat, lamb, chicken and seafood.  Patient states that he gets about 20 g of fiber a day.  He does endorse eating a lot of breads including pita breads.  He also endorses eating a lot of sugar in his coffee and tea.  He occasionally eats fast food.  He does not like sweets.  Exercise:  Patient does not exercise.  He does not track his steps.  His job mainly requires him to be seated.[employer]  He works the second shift and does not have time to exercise.  Sleep:  Patient states that his sleep is not good.  Because he works a second shift sometimes he gets home at 34 or even 3 AM.  Patient states when he is home he is on his phone looking at world events can have a difficult time falling asleep.  Patient states that he sleeps for about 5 hours.  Denies any nighttime awakening.  When he does wake up which is around 7 AM he does not fall back asleep.  Patient feels rested throughout the day however if he has to take a 12-hour shift he begins to feel tired.  Denies falling asleep while reading, watching TV or driving.  Patient states that his wife believes he snores.  Stress:  PHQ-2-0.  Patient does endorse increased weight due to stress. Patient also endorses having a low libido also due to  recent stresses.  States that he has to  take an exam every 3 years and was studying for that.  Patient also stressed about family in [place] and in the [place]. Patient would like his testosterone checked.          REVIEWED INFORMATION      Allergies   Allergen Reactions    Pcn [Penicillins]        Patient Active Problem List   Diagnosis    Physical exam       History reviewed. No pertinent past medical history.    History reviewed. No pertinent surgical history.     Social History     Socioeconomic History    Marital status: Married     Spouse name: None    Number of children: None    Years of education: None    Highest education level: None   Tobacco Use    Smoking status: Never    Smokeless tobacco: Never   Vaping Use    Vaping Use: Never used   Substance and Sexual Activity    Alcohol use: Never    Drug use: Never    Sexual activity: Yes  Partners: Female     Social Determinants of Health     Financial Resource Strain: Low Risk  (03/27/2022)    Overall Financial Resource Strain (CARDIA)     Difficulty of Paying Living Expenses: Not hard at all   Food Insecurity: No Food Insecurity (03/27/2022)    Hunger Vital Sign     Worried About Running Out of Food in the Last Year: Never true     [place] in the Last Year: Never true   Transportation Needs: Unknown (03/27/2022)    PRAPARE - Transportation     Lack of Transportation (Non-Medical): No   Housing Stability: Unknown (03/27/2022)    Housing Stability Vital Sign     Unstable Housing in the Last Year: No        PHYSICAL EXAM     BP 126/62 (Site: Left Upper Arm, Position: Sitting, Cuff Size: Large Adult)   Pulse 81   Temp 97.5 F (36.4 C) (Temporal)   Ht 1.829 m (6')   Wt 97.5 kg (215 lb)   SpO2 96%   BMI 29.16 kg/m    Physical Exam  Vitals and nursing note reviewed.   HENT:      Head: Normocephalic.      Comments: White scarring noted on his right tympanic membrane, unable to see ossicle. earwax and hair noted near membrane      Left Ear: Tympanic membrane normal.   Eyes:       Conjunctiva/sclera: Conjunctivae normal.   Cardiovascular:      Rate and Rhythm: Normal rate and regular rhythm.      Heart sounds: No murmur heard.  Pulmonary:      Effort: Pulmonary effort is normal.   Abdominal:      General: Abdomen is flat. Bowel sounds are normal.      Palpations: Abdomen is soft.      Tenderness: There is no abdominal tenderness.   Musculoskeletal:         General: Normal range of motion.      Cervical back: Normal range of motion and neck supple.   Neurological:      Mental Status: He is alert and oriented to person, place, and time. Mental status is at baseline.   Psychiatric:         Mood and Affect: Mood normal.         Behavior: Behavior normal.         ASSESSMENT/PLAN     1. Physical exam  Assessment & Plan:  Physical exam: Stable  Diet: Advised high fiber diet of 20 gram a day by increasing fresh fruits, vegetables and nuts.  Advised decreasing fats in diet including red meat and processed red meats, refined carbohydrates, and sweetened beverages.   Exercise: Advised moderate intensity exercise (brisk walking) 150 minutes a week or at least 5,000-10,000 steps a day.  Sleep: Advised good sleep habits including turning tv and phone off. 6-8 hours advised.     2. Screening for deficiency anemia  -     CBC with Auto Differential; Future  -     Comprehensive Metabolic Panel; Future  3. Screening for diabetes mellitus  -     Hemoglobin A1C; Future  4. Screening for lipid disorders  -     Lipid Panel; Future  5. Screening for thyroid disorder  -     TSH with Reflex; Future  6. Prostate cancer screening  -     PSA Screening; Future  7.  Low libido  -     Testosterone; Future       Return in about 1 year (around 03/28/2023) for physical .    COMMUNICATION:       Electronically signed by Julianne Handler, MD on 03/27/2022 at 4:14 PM

## 2022-03-28 LAB — COMPREHENSIVE METABOLIC PANEL
ALT: 82 U/L — ABNORMAL HIGH (ref 5–41)
AST: 117 U/L — ABNORMAL HIGH (ref <40)
Albumin/Globulin Ratio: 1.6 (ref 1.0–2.5)
Albumin: 4.6 g/dL (ref 3.5–5.2)
Alkaline Phosphatase: 100 U/L (ref 40–129)
Anion Gap: 10 mmol/L (ref 9–17)
BUN: 11 mg/dL (ref 6–20)
CO2: 25 mmol/L (ref 20–31)
Calcium: 9.6 mg/dL (ref 8.6–10.4)
Chloride: 103 mmol/L (ref 98–107)
Creatinine: 0.9 mg/dL (ref 0.7–1.2)
Est, Glom Filt Rate: >60 mL/min/{1.73_m2} (ref >60)
Glucose: 100 mg/dL — ABNORMAL HIGH (ref 70–99)
Potassium: 5.1 mmol/L (ref 3.7–5.3)
Sodium: 138 mmol/L (ref 135–144)
Total Bilirubin: 0.5 mg/dL (ref 0.3–1.2)
Total Protein: 7.5 g/dL (ref 6.4–8.3)

## 2022-03-28 LAB — CBC WITH AUTO DIFFERENTIAL
Absolute Immature Granulocyte: <0.03 10*3/uL (ref 0.00–0.30)
Basophils %: 0 % (ref 0–2)
Basophils Absolute: <0.03 10*3/uL (ref 0.00–0.20)
Eosinophils %: 1 % (ref 1–4)
Eosinophils Absolute: 0.05 10*3/uL (ref 0.00–0.44)
Hematocrit: 48 % (ref 40.7–50.3)
Hemoglobin: 15.5 g/dL (ref 13.0–17.0)
Immature Granulocytes: 0 %
Lymphocytes %: 41 % (ref 24–43)
Lymphocytes Absolute: 2.33 10*3/uL (ref 1.10–3.70)
MCH: 28.9 pg (ref 25.2–33.5)
MCHC: 32.3 g/dL (ref 28.4–34.8)
MCV: 89.6 fL (ref 82.6–102.9)
MPV: 11.1 fL (ref 8.1–13.5)
Monocytes %: 9 % (ref 3–12)
Monocytes Absolute: 0.51 10*3/uL (ref 0.10–1.20)
NRBC Automated: 0 per 100 WBC
Neutrophils %: 49 % (ref 36–65)
Neutrophils Absolute: 2.79 10*3/uL (ref 1.50–8.10)
Platelets: 222 10*3/uL (ref 138–453)
RBC: 5.36 m/uL (ref 4.21–5.77)
RDW: 14 % (ref 11.8–14.4)
WBC: 5.7 10*3/uL (ref 3.5–11.3)

## 2022-03-28 LAB — LIPID PANEL
Chol/HDL Ratio: 6 — ABNORMAL HIGH (ref <5)
Cholesterol: 259 mg/dL — ABNORMAL HIGH (ref <200)
HDL: 43 mg/dL (ref >40)
LDL Cholesterol: 176 mg/dL — ABNORMAL HIGH (ref 0–130)
Triglycerides: 198 mg/dL — ABNORMAL HIGH (ref <150)

## 2022-03-28 LAB — HEMOGLOBIN A1C
Estimated Avg Glucose: 126 mg/dL
Hemoglobin A1C: 6 % (ref 4.0–6.0)

## 2022-03-28 LAB — TSH WITH REFLEX: TSH: 1.33 u[IU]/mL (ref 0.30–5.00)

## 2022-03-28 LAB — PSA SCREENING: PSA: 0.44 ng/mL (ref <4.1)

## 2022-03-29 LAB — TESTOSTERONE: Testosterone: 543 ng/dL (ref 220–1000)

## 2022-04-01 ENCOUNTER — Encounter

## 2022-04-09 ENCOUNTER — Ambulatory Visit
Admit: 2022-04-09 | Discharge: 2022-04-09 | Payer: BLUE CROSS/BLUE SHIELD | Attending: Internal Medicine | Primary: Student in an Organized Health Care Education/Training Program

## 2022-04-09 DIAGNOSIS — R7989 Other specified abnormal findings of blood chemistry: Secondary | ICD-10-CM

## 2022-04-09 NOTE — Progress Notes (Signed)
Reason for Referral:   Benetta Spar, MD  [address] [place] [address]    Chief Complaint   Patient presents with    New Patient     Patient is here today as a new patient for elevated liver blood tests.           HISTORY OF PRESENT ILLNESS: Jeffrey Zhang is a 51 y.o. male, referred for evaluation of abnormal LFT's which was noted on routine lab testing.  Patient is otherwise asymptomatic.  No c/o- nausea, vomiting, fever, loss of appetite, weight loss, bloating, bleeding PR, diarrhea, nocturnal diarrhea, tenesmus  He does take Ibuprofen or Acetaminophen once a month for headache.  No F/H of liver disease  Denies use of alcohol.    Past Medical,Family, and Social History reviewed and does not contribute to the patient presentingcondition.     Pa tient's PMH/PSH,SH,PSYCH Hx, MEDs, ALLERGIES, and ROS were all reviewed and updated in the appropriate sections.    PAST MEDICAL HISTORY:  History reviewed. No pertinent past medical history.    No past surgical history on file.    CURRENT MEDICATIONS:  No current outpatient medications on file.    ALLERGIES:   Allergies   Allergen Reactions    Pcn [Penicillins]        FAMILY HISTORY:       Problem Relation Age of Onset    Diabetes Mother     High Blood Pressure Father          SOCIAL HISTORY:   Social History     Socioeconomic History    Marital status: Married     Spouse name: Not on file    Number of children: Not on file    Years of education: Not on file    Highest education level: Not on file   Occupational History    Not on file   Tobacco Use    Smoking status: Never    Smokeless tobacco: Never   Vaping Use    Vaping Use: Never used   Substance and Sexual Activity    Alcohol use: Never    Drug use: Never    Sexual activity: Yes     Partners: Female   Other Topics Concern    Not on file   Social History Narrative    Not on file     Social Determinants of Health     Financial Resource Strain: Low Risk  (03/27/2022)     Overall Financial Resource Strain (CARDIA)     Difficulty of Paying Living Expenses: Not hard at all   Food Insecurity: No Food Insecurity (03/27/2022)    Hunger Vital Sign     Worried About Running Out of Food in the Last Year: Never true     [place] in the Last Year: Never true   Transportation Needs: Unknown (03/27/2022)    PRAPARE - Armed forces logistics/support/administrative officer (Medical): Not on file     Lack of Transportation (Non-Medical): No   Physical Activity: Not on file   Stress: Not on file   Social Connections: Not on file   Intimate Partner Violence: Not on file   Housing Stability: Unknown (03/27/2022)    Housing Stability Vital Sign     Unable to Pay for Housing in the Last Year: Not on file     Number of Places Lived in the Last Year: Not on file  Unstable Housing in the Last Year: No       REVIEW OF SYSTEMS: A 12-point review of systemswas obtained and pertinent positives and negatives were enumerated above in the history of present illness. All other reviewed systems / symptoms were negative.  Constitutional: No fever, no chills, no lethargy, no weakness.  HEENT:  No headache, otalgia, itchy eyes, nasal discharge or sore throat.  Cardiac:  No chest pain, dyspnea, orthopnea or PND.  Chest:   No cough, phlegm or wheezing.  Abdomen:       none   Neuro:   No focal weakness, abnormal movements or seizure like activity.  Skin:   No rashes, no itching.  Extremities:  No swelling or joint pains.  ROS was otherwise negative    PHYSICAL EXAMINATION: Vital signs reviewed per the nursing documentation.     Ht 1.829 m (6')   Wt 97.7 kg (215 lb 6.4 oz)   BMI 29.21 kg/m   Body mass index is 29.21 kg/m.     Constitutional: Patient is oriented to person, place, and time. Patient appears well-developed and well-nourished.   Head: Normocephalic and atraumatic.   Eyes: Pupils are equal, round, and reactive to light. EOM are normal.   Neck: Normal range of motion. Neck supple. No JVD present. No tracheal  deviation present. No thyromegaly present.   Cardiovascular: Normal rate, regular rhythm, normal heart sounds and intact distal pulses.   Pulmonary/Chest: Effort normal and breath sounds normal. No stridor. No respiratory distress. He has no wheezes. He has no rales. He exhibits no tenderness.   Abdominal: Soft. Bowel sounds are normal. He exhibits no distension and no mass. There is no tenderness. There is no rebound and no guarding. No hernia.   Musculoskeletal: Normal range of motion.   Neurological: Patient is alert and oriented to person, place, and time.   Psychiatric: Patient has a normal mood and affect. Patient behavior is normal.       LABORATORY DATA: Reviewed  Lab Results   Component Value Date    WBC 5.7 03/27/2022    HGB 15.5 03/27/2022    HCT 48.0 03/27/2022    MCV 89.6 03/27/2022    PLT 222 03/27/2022    NA 138 03/27/2022    K 5.1 03/27/2022    CL 103 03/27/2022    CO2 25 03/27/2022    BUN 11 03/27/2022    CREATININE 0.9 03/27/2022    LABALBU 4.6 03/27/2022    BILITOT 0.5 03/27/2022    ALKPHOS 100 03/27/2022    AST 117 (H) 03/27/2022    ALT 82 (H) 03/27/2022         Lab Results   Component Value Date    RBC 5.36 03/27/2022    HGB 15.5 03/27/2022    MCV 89.6 03/27/2022    MCH 28.9 03/27/2022    MCHC 32.3 03/27/2022    RDW 14.0 03/27/2022    MPV 11.1 03/27/2022    BASOPCT 0 03/27/2022    LYMPHSABS 2.33 03/27/2022    MONOSABS 0.51 03/27/2022    NEUTROABS 2.79 03/27/2022    EOSABS 0.05 03/27/2022    BASOSABS <0.03 03/27/2022     No results found for: "CERULT", "SMAB", "HEPATITIS"      DIAGNOSTIC TESTING:     No results found.       IMPRESSION: Jeffrey Zhang is a 51 y.o. male with abnormal LFT's.  R/o- NASH vs. Viral hepatitis, vs. Iron overload     Plan- 1. Will order anti-SmA,  iron kinetics, viral hepatitis serology  2. Also will order liver US  2. Continue current medications  No follow-ups on file.     Spent 30 minutes providing patient education and counseling.     Thank you for allowing me to  participate in the care of Jeffrey Zhang. For any further questions please do not hesitate to contact me.      I have reviewed and agree with the MA/LPN ROS.     Kathaleen Grinder, MD, [facility]  Office #: [phone]

## 2023-04-01 ENCOUNTER — Ambulatory Visit
Admit: 2023-04-01 | Discharge: 2023-04-01 | Payer: PRIVATE HEALTH INSURANCE | Attending: Student in an Organized Health Care Education/Training Program | Primary: Student in an Organized Health Care Education/Training Program

## 2023-04-01 DIAGNOSIS — Z0000 Encounter for general adult medical examination without abnormal findings: Secondary | ICD-10-CM

## 2023-04-01 NOTE — Progress Notes (Signed)
[facility]  [facility]  [address]  [place] [address]     Date of Visit:  04/01/2023  Patient Name: Jeffrey Zhang   Patient DOB:  Mar 29, 1971     CHIEF COMPLAINT:     Curtis Markham is a 52 y.o. male who presents today for an general visit to be evaluated for the following condition(s):  Chief Complaint   Patient presents with    Annual Exam       REVIEW OF SYSTEM      Review of Systems    HISTORY OF PRESENT ILLNESS     History of Present Illness  The patient is a 52 year old male who presents for evaluation of multiple medical concerns.    He reports no significant health issues over the past year, although he has experienced some weight gain. His diet remains consistent, with a preference for pita bread. Denies eating >20 grams of fiber daily. He engages in physical activity twice a week, and his job involves a considerable amount of walking. He has previously used Ozempic, which resulted in some weight loss. He is considering a shift change at work.  He followed up with the GI doctors but did not get the ultrasound or additional lab work completed.   His sleep pattern is generally good, although he occasionally struggles to fall asleep. Once asleep, he does not wake up to urinate. He works a 12-hour shift every third weekend.    He has expressed interest in the Cologuard test and is open to having his cholesterol levels checked.    He reports no hearing loss but mentions worsening vision, which is corrected with glasses. He visits an eye doctor annually.    He has experienced some skin dryness and itching over the past few days but reports no foot swelling or numbness. Has been using lotion that helps with his skin dryness.      REVIEWED INFORMATION      Allergies   Allergen Reactions    Pcn [Penicillins]        Patient Active Problem List   Diagnosis   (none) - all problems resolved or deleted       No past medical history on file.    No past surgical history on file.      Social History     Socioeconomic History    Marital status: Married     Spouse name: None    Number of children: None    Years of education: None    Highest education level: None   Tobacco Use    Smoking status: Never    Smokeless tobacco: Never   Vaping Use    Vaping status: Never Used   Substance and Sexual Activity    Alcohol use: Never    Drug use: Never    Sexual activity: Yes     Partners: Female     Social Determinants of Health     Financial Resource Strain: Low Risk  (04/01/2023)    Overall Financial Resource Strain (CARDIA)     Difficulty of Paying Living Expenses: Not hard at all   Food Insecurity: No Food Insecurity (04/01/2023)    Hunger Vital Sign     Worried About Running Out of Food in the Last Year: Never true     Ran Out of Food in the Last Year: Never true   Transportation Needs: Unknown (04/01/2023)    PRAPARE - Transportation     Lack of Transportation (Non-Medical):  No   Housing Stability: Unknown (04/01/2023)    Housing Stability Vital Sign     Homeless in the Last Year: No        Results        PHYSICAL EXAM     BP 114/66 (Site: Left Upper Arm, Position: Sitting, Cuff Size: Medium Adult)   Pulse 66   Temp 97.8 F (36.6 C) (Temporal)   Ht 1.842 m (6' 0.5")   Wt 97.2 kg (214 lb 3.2 oz)   SpO2 98%   BMI 28.65 kg/m    Physical Exam  Vitals and nursing note reviewed.   HENT:      Head: Normocephalic.      Right Ear: Tympanic membrane normal.      Left Ear: Tympanic membrane normal.   Eyes:      Conjunctiva/sclera: Conjunctivae normal.   Cardiovascular:      Rate and Rhythm: Normal rate and regular rhythm.      Heart sounds: No murmur heard.  Pulmonary:      Effort: Pulmonary effort is normal.   Abdominal:      General: Abdomen is flat. Bowel sounds are normal.      Palpations: Abdomen is soft.      Tenderness: There is no abdominal tenderness.   Musculoskeletal:         General: Normal range of motion.      Cervical back: Normal range of motion and neck supple.   Skin:     General:  Skin is warm.   Neurological:      Mental Status: He is alert and oriented to person, place, and time. Mental status is at baseline.   Psychiatric:         Mood and Affect: Mood normal.         Behavior: Behavior normal.          ASSESSMENT/PLAN     Assessment & Plan  1. Physical exam  He reports gaining some weight and attributes it to stress and poor dietary habits, particularly consuming a lot of bread. He currently weighs 214 pounds, which is 1 pound lighter than last year. He was advised to aim for a daily step count of 5000 to 10000 steps and to consider switching to whole grain bread for better fiber intake. A sample of Ozempic was provided to help with weight management.    2.. Hyperlipidemia-uncontrolled  His cholesterol levels have been consistently above 200 for the past 10-20 years. Last year's levels indicated a 5% risk of heart attack in the next decade. A lipid panel was ordered, and he was instructed to fast overnight before the test. He was advised to continue monitoring his cholesterol levels and consider lifestyle changes such as increased exercise.    4. Health Maintenance.  A Cologuard test was ordered for colon cancer screening. He expressed a preference for non-invasive testing methods.       Diagnosis Orders   1. Annual physical exam        2. Screening for deficiency anemia        3. Screening for thyroid disorder        4. Screening for diabetes mellitus        5. Screening for lipid disorders        6. Encounter for vitamin deficiency screening        7. Screening for prostate cancer        8. Screening for colorectal cancer  Fecal DNA Colorectal cancer  screening (Cologuard)      9. Moderate mixed hyperlipidemia not requiring statin therapy  Lipid Panel            COMMUNICATION:     .    The patient (or guardian, if applicable) and other individuals in attendance with the patient were advised that Artificial Intelligence will be utilized during this visit to record, process the  conversation to generate a clinical note, and support improvement of the AI technology. The patient (or guardian, if applicable) and other individuals in attendance at the appointment consented to the use of AI, including the recording.                  Electronically signed by Julianne Handler, MD on 04/01/2023 at 6:52 PM

## 2023-07-12 LAB — FECAL DNA COLORECTAL CANCER SCREENING (COLOGUARD)

## 2023-08-29 LAB — FECAL DNA COLORECTAL CANCER SCREENING (COLOGUARD): FIT-DNA (Cologuard): NEGATIVE

## 2024-01-15 ENCOUNTER — Inpatient Hospital Stay: Payer: PRIVATE HEALTH INSURANCE | Primary: Student in an Organized Health Care Education/Training Program

## 2024-01-15 DIAGNOSIS — Z125 Encounter for screening for malignant neoplasm of prostate: Principal | ICD-10-CM

## 2024-01-15 LAB — CBC WITH AUTO DIFFERENTIAL
Basophils %: 0 % (ref 0–2)
Basophils Absolute: <0.03 k/uL (ref 0.00–0.20)
Eosinophils %: 0 % (ref 0–4)
Eosinophils Absolute: <0.03 k/uL (ref 0.00–0.44)
Hematocrit: 46.8 % (ref 41.0–53.0)
Hemoglobin: 15.3 g/dL (ref 13.5–17.5)
Immature Granulocytes %: 0 %
Immature Granulocytes Absolute: <0.03 k/uL (ref 0.00–0.30)
Lymphocytes %: 32 % (ref 24–44)
Lymphocytes Absolute: 1.91 k/uL (ref 1.10–3.70)
MCH: 28.6 pg (ref 26.0–34.0)
MCHC: 32.7 g/dL (ref 31.0–37.0)
MCV: 87.5 fL (ref 80.0–100.0)
MPV: 10.7 fL (ref 8.0–13.5)
Monocytes %: 7 % (ref 3–12)
Monocytes Absolute: 0.4 k/uL (ref 0.10–1.20)
NRBC Automated: 0 /100{WBCs}
Neutrophils %: 61 % (ref 36–66)
Neutrophils Absolute: 3.59 k/uL (ref 1.50–8.10)
Platelets: 213 k/uL (ref 150–450)
RBC: 5.35 m/uL (ref 4.21–5.77)
RDW: 13.6 % (ref 11.5–14.9)
WBC: 6 k/uL (ref 3.5–11.0)

## 2024-01-15 LAB — TSH REFLEX TO FT4: TSH: 1.31 u[IU]/mL (ref 0.27–4.20)

## 2024-01-15 LAB — LIPID PANEL
Chol/HDL Ratio: 6.3
Cholesterol, Total: 269 mg/dL — ABNORMAL HIGH (ref 0–199)
HDL: 43 mg/dL (ref >40)
LDL Cholesterol: 189 mg/dL — ABNORMAL HIGH (ref 0–100)
Triglycerides: 184 mg/dL — ABNORMAL HIGH (ref 0–149)

## 2024-01-15 LAB — COMPREHENSIVE METABOLIC PANEL
ALT: 46 U/L (ref 10–50)
AST: 32 U/L (ref 10–50)
Albumin: 4.6 g/dL (ref 3.5–5.2)
Alkaline Phosphatase: 96 U/L (ref 40–129)
Anion Gap: 12 mmol/L (ref 9–16)
BUN: 13 mg/dL (ref 6–20)
CO2: 24 mmol/L (ref 20–31)
Calcium: 9.9 mg/dL (ref 8.6–10.4)
Chloride: 103 mmol/L (ref 98–107)
Creatinine: 1 mg/dL (ref 0.7–1.2)
Est, Glom Filt Rate: >90 mL/min/1.73m2 (ref >60)
Glucose: 107 mg/dL — ABNORMAL HIGH (ref 74–99)
Potassium: 4.6 mmol/L (ref 3.7–5.3)
Sodium: 139 mmol/L (ref 136–145)
Total Bilirubin: 0.8 mg/dL (ref 0.0–1.2)
Total Protein: 7.5 g/dL (ref 6.6–8.7)

## 2024-01-15 NOTE — Telephone Encounter (Signed)
 Be Well please sign

## 2024-01-16 LAB — PSA SCREENING: PSA: 0.44 ng/mL (ref 0.00–4.00)

## 2024-01-16 LAB — HEMOGLOBIN A1C
Estimated Avg Glucose: 126 mg/dL
Hemoglobin A1C: 6 % (ref 4.0–6.0)

## 2024-01-16 LAB — VITAMIN B12 & FOLATE
Folate: 14.5 ng/mL (ref 4.8–24.2)
Vitamin B-12: 319 pg/mL (ref 232–1245)

## 2024-01-16 LAB — VITAMIN D 25 HYDROXY: Vit D, 25-Hydroxy: 21.1 ng/mL — ABNORMAL LOW (ref 30.0–100.0)

## 2024-04-05 ENCOUNTER — Encounter
Payer: PRIVATE HEALTH INSURANCE | Attending: Student in an Organized Health Care Education/Training Program | Primary: Student in an Organized Health Care Education/Training Program
